# Patient Record
Sex: Female | Born: 1980 | Race: Black or African American | Hispanic: No | Marital: Married | Smoking: Never smoker
Health system: Southern US, Community
[De-identification: ages and names within clinical notes are randomized; demographics above are authoritative.]

## PROBLEM LIST (undated history)

## (undated) ENCOUNTER — Inpatient Hospital Stay (HOSPITAL_COMMUNITY): Payer: Self-pay

## (undated) DIAGNOSIS — R011 Cardiac murmur, unspecified: Secondary | ICD-10-CM

## (undated) DIAGNOSIS — G43909 Migraine, unspecified, not intractable, without status migrainosus: Secondary | ICD-10-CM

## (undated) DIAGNOSIS — J45909 Unspecified asthma, uncomplicated: Secondary | ICD-10-CM

## (undated) DIAGNOSIS — R87629 Unspecified abnormal cytological findings in specimens from vagina: Secondary | ICD-10-CM

## (undated) HISTORY — PX: WISDOM TOOTH EXTRACTION: SHX21

## (undated) SURGERY — Surgical Case
Anesthesia: *Unknown

---

## 2001-03-10 ENCOUNTER — Emergency Department (HOSPITAL_COMMUNITY): Admission: EM | Admit: 2001-03-10 | Discharge: 2001-03-11 | Payer: Self-pay | Admitting: Emergency Medicine

## 2002-06-18 ENCOUNTER — Emergency Department (HOSPITAL_COMMUNITY): Admission: EM | Admit: 2002-06-18 | Discharge: 2002-06-18 | Payer: Self-pay | Admitting: Emergency Medicine

## 2002-08-29 ENCOUNTER — Emergency Department (HOSPITAL_COMMUNITY): Admission: EM | Admit: 2002-08-29 | Discharge: 2002-08-29 | Payer: Self-pay

## 2002-10-31 ENCOUNTER — Emergency Department (HOSPITAL_COMMUNITY): Admission: EM | Admit: 2002-10-31 | Discharge: 2002-10-31 | Payer: Self-pay | Admitting: *Deleted

## 2002-10-31 ENCOUNTER — Encounter: Payer: Self-pay | Admitting: Emergency Medicine

## 2003-05-03 ENCOUNTER — Emergency Department (HOSPITAL_COMMUNITY): Admission: EM | Admit: 2003-05-03 | Discharge: 2003-05-04 | Payer: Self-pay | Admitting: Emergency Medicine

## 2003-05-04 ENCOUNTER — Encounter: Payer: Self-pay | Admitting: Emergency Medicine

## 2003-09-03 ENCOUNTER — Other Ambulatory Visit: Admission: RE | Admit: 2003-09-03 | Discharge: 2003-09-03 | Payer: Self-pay | Admitting: Obstetrics and Gynecology

## 2003-11-30 ENCOUNTER — Inpatient Hospital Stay (HOSPITAL_COMMUNITY): Admission: AD | Admit: 2003-11-30 | Discharge: 2003-12-01 | Payer: Self-pay | Admitting: Obstetrics and Gynecology

## 2004-02-02 ENCOUNTER — Inpatient Hospital Stay (HOSPITAL_COMMUNITY): Admission: AD | Admit: 2004-02-02 | Discharge: 2004-02-05 | Payer: Self-pay | Admitting: Obstetrics and Gynecology

## 2004-02-06 ENCOUNTER — Encounter (INDEPENDENT_AMBULATORY_CARE_PROVIDER_SITE_OTHER): Payer: Self-pay | Admitting: Specialist

## 2004-02-06 ENCOUNTER — Inpatient Hospital Stay (HOSPITAL_COMMUNITY): Admission: AD | Admit: 2004-02-06 | Discharge: 2004-02-09 | Payer: Self-pay | Admitting: Obstetrics and Gynecology

## 2004-05-19 ENCOUNTER — Emergency Department (HOSPITAL_COMMUNITY): Admission: EM | Admit: 2004-05-19 | Discharge: 2004-05-19 | Payer: Self-pay | Admitting: Family Medicine

## 2004-10-06 ENCOUNTER — Emergency Department (HOSPITAL_COMMUNITY): Admission: EM | Admit: 2004-10-06 | Discharge: 2004-10-06 | Payer: Self-pay | Admitting: Family Medicine

## 2004-11-02 ENCOUNTER — Emergency Department (HOSPITAL_COMMUNITY): Admission: EM | Admit: 2004-11-02 | Discharge: 2004-11-02 | Payer: Self-pay | Admitting: Family Medicine

## 2004-12-10 ENCOUNTER — Emergency Department (HOSPITAL_COMMUNITY): Admission: EM | Admit: 2004-12-10 | Discharge: 2004-12-10 | Payer: Self-pay | Admitting: Family Medicine

## 2005-05-20 ENCOUNTER — Emergency Department (HOSPITAL_COMMUNITY): Admission: EM | Admit: 2005-05-20 | Discharge: 2005-05-20 | Payer: Self-pay | Admitting: Family Medicine

## 2005-06-07 ENCOUNTER — Inpatient Hospital Stay (HOSPITAL_COMMUNITY): Admission: AD | Admit: 2005-06-07 | Discharge: 2005-06-07 | Payer: Self-pay | Admitting: Obstetrics and Gynecology

## 2005-06-24 ENCOUNTER — Other Ambulatory Visit: Admission: RE | Admit: 2005-06-24 | Discharge: 2005-06-24 | Payer: Self-pay | Admitting: Obstetrics and Gynecology

## 2005-11-06 ENCOUNTER — Inpatient Hospital Stay (HOSPITAL_COMMUNITY): Admission: AD | Admit: 2005-11-06 | Discharge: 2005-11-06 | Payer: Self-pay | Admitting: Obstetrics and Gynecology

## 2006-01-09 ENCOUNTER — Inpatient Hospital Stay (HOSPITAL_COMMUNITY): Admission: AD | Admit: 2006-01-09 | Discharge: 2006-01-09 | Payer: Self-pay | Admitting: Obstetrics and Gynecology

## 2006-01-10 ENCOUNTER — Inpatient Hospital Stay (HOSPITAL_COMMUNITY): Admission: AD | Admit: 2006-01-10 | Discharge: 2006-01-12 | Payer: Self-pay | Admitting: Obstetrics and Gynecology

## 2006-08-09 ENCOUNTER — Other Ambulatory Visit: Admission: RE | Admit: 2006-08-09 | Discharge: 2006-08-09 | Payer: Self-pay | Admitting: Obstetrics and Gynecology

## 2007-02-13 ENCOUNTER — Inpatient Hospital Stay (HOSPITAL_COMMUNITY): Admission: AD | Admit: 2007-02-13 | Discharge: 2007-02-13 | Payer: Self-pay | Admitting: Obstetrics and Gynecology

## 2007-02-15 ENCOUNTER — Ambulatory Visit (HOSPITAL_COMMUNITY): Admission: RE | Admit: 2007-02-15 | Discharge: 2007-02-15 | Payer: Self-pay | Admitting: Obstetrics and Gynecology

## 2007-02-15 ENCOUNTER — Encounter (INDEPENDENT_AMBULATORY_CARE_PROVIDER_SITE_OTHER): Payer: Self-pay | Admitting: Obstetrics and Gynecology

## 2009-09-28 ENCOUNTER — Emergency Department (HOSPITAL_COMMUNITY): Admission: EM | Admit: 2009-09-28 | Discharge: 2009-09-28 | Payer: Self-pay | Admitting: Family Medicine

## 2010-05-14 ENCOUNTER — Inpatient Hospital Stay (HOSPITAL_COMMUNITY): Admission: AD | Admit: 2010-05-14 | Discharge: 2010-05-15 | Payer: Self-pay | Admitting: Obstetrics and Gynecology

## 2010-11-27 LAB — URINALYSIS, ROUTINE W REFLEX MICROSCOPIC
Glucose, UA: NEGATIVE mg/dL
Protein, ur: NEGATIVE mg/dL
pH: 6 (ref 5.0–8.0)

## 2010-11-27 LAB — DIFFERENTIAL
Basophils Absolute: 0.1 10*3/uL (ref 0.0–0.1)
Basophils Relative: 1 % (ref 0–1)
Lymphs Abs: 2.1 10*3/uL (ref 0.7–4.0)
Monocytes Absolute: 0.5 10*3/uL (ref 0.1–1.0)
Monocytes Relative: 8 % (ref 3–12)

## 2010-11-27 LAB — CBC
HCT: 37.5 % (ref 36.0–46.0)
Hemoglobin: 12.9 g/dL (ref 12.0–15.0)
MCH: 33.3 pg (ref 26.0–34.0)
MCHC: 34.3 g/dL (ref 30.0–36.0)
RBC: 3.87 MIL/uL (ref 3.87–5.11)
RDW: 12.9 % (ref 11.5–15.5)

## 2010-11-27 LAB — HCG, QUANTITATIVE, PREGNANCY: hCG, Beta Chain, Quant, S: 7449 m[IU]/mL — ABNORMAL HIGH (ref <5)

## 2010-12-27 ENCOUNTER — Inpatient Hospital Stay (HOSPITAL_COMMUNITY)
Admission: AD | Admit: 2010-12-27 | Discharge: 2010-12-28 | Disposition: A | Discharge status: Home / Self Care | Payer: Medicaid Other | Source: Ambulatory Visit | Attending: Obstetrics and Gynecology | Admitting: Obstetrics and Gynecology

## 2010-12-27 DIAGNOSIS — O479 False labor, unspecified: Secondary | ICD-10-CM | POA: Insufficient documentation

## 2011-01-06 ENCOUNTER — Inpatient Hospital Stay (HOSPITAL_COMMUNITY)
Admission: EM | Admit: 2011-01-06 | Discharge: 2011-01-09 | DRG: 765 | Disposition: A | Discharge status: Home / Self Care | Payer: Medicaid Other | Source: Ambulatory Visit | Attending: Obstetrics and Gynecology | Admitting: Obstetrics and Gynecology

## 2011-01-06 ENCOUNTER — Other Ambulatory Visit: Payer: Self-pay | Admitting: Obstetrics and Gynecology

## 2011-01-06 DIAGNOSIS — O411090 Infection of amniotic sac and membranes, unspecified, unspecified trimester, not applicable or unspecified: Secondary | ICD-10-CM | POA: Diagnosis present

## 2011-01-06 DIAGNOSIS — O34219 Maternal care for unspecified type scar from previous cesarean delivery: Secondary | ICD-10-CM | POA: Diagnosis present

## 2011-01-06 DIAGNOSIS — Z22330 Carrier of Group B streptococcus: Secondary | ICD-10-CM

## 2011-01-06 DIAGNOSIS — O99892 Other specified diseases and conditions complicating childbirth: Secondary | ICD-10-CM | POA: Diagnosis present

## 2011-01-06 LAB — CBC
Hemoglobin: 11.2 g/dL — ABNORMAL LOW (ref 12.0–15.0)
MCH: 31.3 pg (ref 26.0–34.0)
MCV: 95 fL (ref 78.0–100.0)
RBC: 3.58 MIL/uL — ABNORMAL LOW (ref 3.87–5.11)

## 2011-01-06 LAB — DIFFERENTIAL
Basophils Absolute: 0 10*3/uL (ref 0.0–0.1)
Eosinophils Absolute: 0 10*3/uL (ref 0.0–0.7)
Monocytes Absolute: 1 10*3/uL (ref 0.1–1.0)
Neutro Abs: 15.2 10*3/uL — ABNORMAL HIGH (ref 1.7–7.7)
Neutrophils Relative %: 89 % — ABNORMAL HIGH (ref 43–77)

## 2011-01-07 LAB — CBC
MCH: 31 pg (ref 26.0–34.0)
MCHC: 32.5 g/dL (ref 30.0–36.0)
RBC: 3.29 MIL/uL — ABNORMAL LOW (ref 3.87–5.11)
RDW: 13.1 % (ref 11.5–15.5)

## 2011-01-09 ENCOUNTER — Encounter (HOSPITAL_COMMUNITY): Admission: RE | Admit: 2011-01-09 | Payer: Self-pay | Source: Ambulatory Visit

## 2011-01-09 LAB — DIFFERENTIAL
Basophils Absolute: 0 10*3/uL (ref 0.0–0.1)
Eosinophils Absolute: 0.2 10*3/uL (ref 0.0–0.7)
Eosinophils Relative: 2 % (ref 0–5)
Lymphocytes Relative: 18 % (ref 12–46)
Neutrophils Relative %: 72 % (ref 43–77)

## 2011-01-09 LAB — CBC
Platelets: 228 10*3/uL (ref 150–400)
WBC: 9.9 10*3/uL (ref 4.0–10.5)

## 2011-01-14 NOTE — Discharge Summary (Signed)
NAME:  [name] Fry, [name] Fry                 ACCOUNT NO.:  [id number]  MEDICAL RECORD NO.:  [id number]           PATIENT TYPE:  I  LOCATION:  9318                          [facility]  PHYSICIAN:  Jeffrey Voth A. Mily Malecki, M.D. DATE OF BIRTH:  1981/01/07  DATE OF ADMISSION:  01/06/2011 DATE OF DISCHARGE:                              DISCHARGE SUMMARY   ADMITTING DIAGNOSES: 1. Intrauterine pregnancy at 39-1/7 weeks. 2. Early active labor. 3. Positive group B strep. 4. Nonreassuring fetal heart rate pattern. 5. Probable chorioamnionitis. 6. Previous cesarean section with desire for vaginal birth after     cesarean with one previous successful vaginal birth after cesarean.  DISCHARGE DIAGNOSES: 1. Intrauterine pregnancy at term. 2. Spontaneous rupture of membranes with labor. 3. Failed VBAC due to NRFHR. 4. Nonreassuring fetal heart rates. 5. Chorioamnionitis. 6. Neonatal intensive care unit infant.  PROCEDURES: 1. Repeat low transverse cesarean section. 2. Spinal anesthesia.  HOSPITAL COURSE:  [name] Fry is a 30 year old, gravida 5, para 1-1-2-2, at 39-1/7 weeks who presented on the afternoon of January 06, 2011, with spontaneous rupture of membranes reported at approximately 2 a.m.  She presented to the hospital at approximately 12:25 p.m.  Her pregnancy had been remarkable for: 1. Previous cesarean section with successful VBAC subsequently 2. History of previous preterm delivery secondary to nonreassuring     fetal heart rate and abruption. 3. History of PIH. 4. History of abnormal Pap. 5. History of STDs. 6. Positive group B strep.  On arrival, the patient's temp was 99.2, fetal heart rate was in the 170s with decreased variability and no reactivity.  There were no decelerations noted.  The patient was noted to be leaking clear fluid. Other vital signs were stable.  Cervical exam was 4, 80%, vertex -1. The patient was admitted to Labor and Delivery; however, the fetal heart rate  tracing did not improve despite IV hydration, Tylenol, and oxygen therapy.  The patient's cervix fairly quickly changed to 6 cm.  Fetal scalp lead and IUPC were placed.  There were no accelerations noted with scalp stim.  Dr. Normand Sloop was consulted and the decision was made to proceed with cesarean section for nonreassuring fetal heart rate.  At that time, the patient's temp was 101.  She was taken to the operating room where Dr. Normand Sloop performed a repeat low transverse cesarean section for delivery of a viable female, weight was 7 pounds and 11 ounces.  Apgars were 9 and 9.  Infant was taken for sepsis workup to NICU given the patient's clinical circumstances.  Mother was taken to recovery in good condition.  By postop day #1, the patient was doing well.  She was pumping secondary to the baby being in NICU, although the baby was doing well.  The patient was afebrile.  Her physical exam was within normal limits.  She was having some gaseous distention.  Her hemoglobin was 10.2.  Her white blood cell count was 23.2 which was elevated from 17.2.  The patient's dressing was clean, dry, and intact and her lochia was scant.  By C-section day #2, she was feeling much better.  She was using pain medication with benefit.  She was thumping and nursing.  Temp was 99.3.  She completed 24 hours of IV antibiotics. Infant remained stable in NICU.  By postop day #3, the patient was again doing well.  She had a hemoglobin of 9.8.  On day #3, white blood cell count down to 9.9 and platelet count 228, neutrophils were 72%.  She did still have some gaseous distention, but bowel sounds were present in all quadrants.  Her incision was clean, dry, and intact.  Lochia was scant.  Fundus was firm.  Extremities were normal.  She was undecided about birth control at that time.  She was deemed to have received full benefit of her hospital stay and was discharged home in stable condition.  DISCHARGE INSTRUCTIONS:   Per [facility] handout.  DISCHARGE MEDICATIONS: 1. Motrin 600 mg p.o. q.6 h. p.r.n. pain. 2. Percocet 5/325 one to two p.o. q.3-4 h. p.r.n. pain.  DISCHARGE FOLLOWUP:  It will occur in 6 weeks at [facility].     Renaldo Reel Emilee Hero, C.N.M.   ______________________________ Pierre Bali Normand Sloop, M.D.    VLL/MEDQ  D:  01/09/2011  T:  01/09/2011  Job:  [id number]  Electronically Signed by Nigel Bridgeman C.N.M. on 01/09/2011 01:25:10 PM Electronically Signed by Jaymes Graff M.D. on 01/14/2011 10:48:11 AM

## 2011-01-14 NOTE — Op Note (Signed)
NAME:  [name] Fry, [name] Fry                 ACCOUNT NO.:  [id number]  MEDICAL RECORD NO.:  [id number]           PATIENT TYPE:  I  LOCATION:  9318                          [facility]  [facility]  PHYSICIAN:  Ygnacio Fecteau A. Nahdia Doucet, M.D. DATE OF BIRTH:  20-Jun-1981  DATE OF PROCEDURE: DATE OF DISCHARGE:                              OPERATIVE REPORT   PREOPERATIVE DIAGNOSES: 1. Pregnancy at term, rupture of membranes. 2. Attempted vaginal birth after cesarean section. 3. Nonreassuring fetal heart tones. 4. Chorioamnionitis.  POSTOPERATIVE DIAGNOSES: 1. Pregnancy at term, rupture of membranes. 2. Attempted vaginal birth after cesarean section. 3. Nonreassuring fetal heart tones. 4. Chorioamnionitis.  PROCEDURE:  Repeat cesarean section.  SURGEON:  Onyinyechi Huante A. Vong Garringer, MD  ASSISTANTS:  Anabel Halon, [facility], CNM and Renaldo Reel. Emilee Hero, CNM  ANESTHESIA:  Spinal.  FINDINGS:  Female infant in vertex presentation with clear fluid, born at 3, Apgars of 8 and 9.  Placenta was sent to pathology.  ESTIMATED BLOOD LOSS:  650 mL.  URINE OUTPUT:  250 mL clear urine.  IV FLUIDS:  2000 mL crystalloid.  Cord pH's are venous 7.37, arterial 7.33.  COMPLICATIONS:  None.  The patient went to PACU in stable condition.  PROCEDURE IN DETAIL:  The patient was taken to the operating room where she was given spinal anesthesia, placed in dorsal supine position with a left lateral tilt.  Once her anesthesia was found to be adequate, 10 mL of 0.5% Marcaine with epinephrine 1:200 was placed along the previous incision and incision was then made with a scalpel along her abdomen and carried down to the fascia using Bovie cautery.  The fascia was incised in midline, extended bilaterally using Mayo scissors.  Kochers x2 placed in the superior aspect of the fascia was dissected off the rectus muscle both sharply and bluntly.  The inferior aspect of the fascia was dissected in a similar fashion.  The peritoneum was identified,  tented up, entered sharply, and extended bluntly.  The bladder blade was inserted.  The vesicouterine peritoneum was identified, tented up, entered sharply, and extended bilaterally.  The bladder flap created digitally.  Bladder blade was reinserted and lower transverse uterine incision was made with a scalpel and extended bluntly transversely.  The bag of water was broken with Allis clamp with clear fluid.  The infant was delivered without difficulty.  Cord was clamped and cut.  Cord gases were obtained.  Again, venous pH was 7.37 arterial pH was 7.33.  The infant was handed off to waiting pediatricians.  The placenta was manually delivered.  The uterus cleared of all clot and debris.  The uterus incision was repaired with 0 Vicryl and a second layer of 0 Vicryl was used to imbricate the uterus.  There was a little bleeding area of the left side of the uterus.  The incision was made hemostatic with figure-of-eight suture.  Copious irrigation was done.  Hemostasis was assured.  The patient had normal abdominal anatomy.  Normal- appearing tubes and ovaries.  Peritoneum was reapproximated using 0 chromic in a running fashion.  The fascia was closed using 0 Vicryl in a  running fashion.  Before the fascia was closed, the muscles were irrigated, noted to be hemostatic.  Subcutaneous tissue was reapproximated using 2-0 plain, and the skin was closed using 3-0 Monocryl in a subcuticular fashion.  Sponge, lap, and needle counts were correct.  The patient was taken to recovery room in stable condition.     Red Mandt A. Normand Sloop, M.D.     NAD/MEDQ  D:  01/06/2011  T:  01/07/2011  Job:  [id number]  Electronically Signed by Jaymes Graff M.D. on 01/14/2011 10:47:54 AM

## 2011-01-27 NOTE — Op Note (Signed)
[name] Fry, [name] Fry                  ACCOUNT NO.:  [id number]   MEDICAL RECORD NO.:  [id number]            PATIENT TYPE:   LOCATION:                                 FACILITY:   PHYSICIAN:  Naima A. Dillard, M.D.      DATE OF BIRTH:   DATE OF PROCEDURE:  02/15/2007  DATE OF DISCHARGE:                               OPERATIVE REPORT   PREOPERATIVE DIAGNOSIS:  Blighted ovum, [redacted] week gestation.   POSTOPERATIVE DIAGNOSIS:  Blighted ovum, [redacted] week gestation.   PROCEDURE:  Dilation and evacuation.   ASSISTANT:  None.   ANESTHESIA:  MAC and local.   ESTIMATED BLOOD LOSS:  Minimal.   SPECIMENS:  Products of conception, sent to pathology.   COMPLICATIONS:  None.   DISPOSITION:  The patient went to [facility] unit in stable condition.   PROCEDURE IN DETAIL:  The patient was taken to the operating room where  she was given MAC anesthesia and placed in the dorsal lithotomy position  and prepped and draped in a normal sterile fashion.  The bladder was  also drained.  The uterus was noted to be 7 weeks size, retroverted and  no adnexal masses.  A bivalved speculum was inserted into the vagina.  The anterior lip of the cervix was grasped with a single tooth  tenaculum.  The cervix was infiltrated with 20 mL of 2% lidocaine for  cervical block.  The cervix was dilated with Pratt dilators, up to 21.  A size 7 suction curettage was placed into the uterine cavity and  suction curettage until a grit texture was noted.  A sharp curet was  placed into the uterine cavity and done until a gritty texture was  noted.  The suction curettage was replaced and again, scant blood was  noted which was removed from the vagina and cervix.  Sponge, lap and  instrument counts were noted x2.   The patient went to the recovery room in stable condition.      Naima A. Normand Sloop, M.D.  Electronically Signed     NAD/MEDQ  D:  02/15/2007  T:  02/16/2007  Job:  [id number]

## 2011-01-27 NOTE — Op Note (Signed)
NAMEFRANCIES, INCH                  ACCOUNT NO.:  [id number]   MEDICAL RECORD NO.:  [id number]            PATIENT TYPE:   LOCATION:                                 FACILITY:   PHYSICIAN:  Naima A. Dillard, M.D.      DATE OF BIRTH:   DATE OF PROCEDURE:  02/15/2007  DATE OF DISCHARGE:                               OPERATIVE REPORT   PREOPERATIVE DIAGNOSIS:  Blighted ovum.   POSTOPERATIVE DIAGNOSIS:  Blighted ovum.   PROCEDURE:  Dilation and evacuation.   ASSISTANT:  None.   ANESTHESIA:  MAC and local.   SPECIMENS:  Products of conception.   ESTIMATED BLOOD LOSS:  Minimal.   COMPLICATIONS:  None.   DISPOSITION:  The patient went to [facility] unit in stable condition.   PROCEDURE IN DETAIL:  The patient was taken to the operating room where  she was given MAC anesthesia and placed in the dorsal lithotomy position  and prepped and draped in a normal sterile fashion.  The bladder was  drained with a straight catheter.  A bivalved speculum was inserted into  the vagina.  The anterior lip of the cervix was grasped with a single  tooth tenaculum, 20 mL of 2% lidocaine was used for a cervical block.  The cervix was dilated with Pratt dilators, up to 23.  A size 7 suction  curettage was placed into the uterine cavity until a gritty texture was  noted.  A sharp curet was placed into the uterus and the uterus was  curetted until a gritty texture was noted of all walls of the uterus.  The suction curettage was replaced and a scant blood and products were  obtained.  All instruments were removed from the vagina and cervix.  The  tenaculum site was made hemostatic with sliver nitrate.  Sponge, lap and  instrument counts were correct.  The patient went to the recovery room  in stable condition.      Naima A. Normand Sloop, M.D.  Electronically Signed     NAD/MEDQ  D:  02/15/2007  T:  02/16/2007  Job:  [id number]

## 2011-01-27 NOTE — H&P (Signed)
[name] Fry, [name] Fry                  ACCOUNT NO.:  [id number]   MEDICAL RECORD NO.:  [id number]          PATIENT TYPE:  MAT   LOCATION:  MATC                          [facility]  [facility]   PHYSICIAN:  Naima A. Dillard, M.D. DATE OF BIRTH:  [birth year]   DATE OF ADMISSION:  02/13/2007  DATE OF DISCHARGE:                              HISTORY & PHYSICAL   [name] is a 30 year old, gravida 3, para 1-1-0-2 at 7-1/7 weeks by  last menstrual period who presented to maternity admissions unit on February 13, 2007, with complaint of lower abdominal cramping.  She had this  throughout most of her pregnancy and has become slightly worse today.  She denied any bleeding.  She appeared to be in no apparent distress.  Her history was remarkable for #1, previous preterm delivery at 31 weeks  for nonreassuring fetal heart rate by C section.  Number 2, history of  previous C section with subsequent vaginal birth after cesarean, and #3,  history of PIH.   LABORATORY DATA:  Blood type is A positive.  CBC showed hemoglobin 12.2,  hematocrit 35.7, white blood cell count 7.5, and platelets 303.  Neutrophils were 60.  Quantitative HCG was 30,652. Wet prep was done  that was negative.  GC and chlamydia cultures were done with pending  results at this point.   ALLERGIES:  IBUPROFEN and ALEVE.   OBSTETRICAL HISTORY:  In 2007, April, the patient had a vaginal birth  after cesarean of a viable female, weight 7 pounds 10 ounces.  She did  well and had no complications.  In 2005, the patient had a primary low  transverse cesarean section at 31 weeks and 2 days secondary to [facility],  nonreassuring fetal heart rate, positive parvovirus titers and  subsequent abruption with birth of a female infant named [name] Fry.  In  2003, the patient had a spontaneous miscarriage with no complications.   PAST MEDICAL HISTORY:  Unremarkable with no history of hypertension,  diabetes, thyroid disease, or other issues.   PAST SURGICAL  HISTORY:  She reports the previously noted primary low  transverse cesarean section.   FAMILY HISTORY:  Maternal grandmother has hypertension, diabetes.  Maternal great grandmother has lung cancer.   GENETIC HISTORY:  Unremarkable.   SOCIAL HISTORY:  The patient is engaged to be married.  The patient is  employed at [employer].  She lives with her two children  and her fiance.  She denies any alcohol, drug, or tobacco use during  this pregnancy.   REVIEW OF SYSTEMS:  The patient reports the previously noted lower  abdominal cramping.   PHYSICAL EXAMINATION:  VITAL SIGNS:  Stable.  The patient is afebrile.  HEENT:  Within normal limits.  LUNGS:  Bilateral breath sounds are clear.  HEART: Regular rate and rhythm  BREASTS:  Soft and nontender.  PELVIC:  Uterus is small and nontender, slightly at upper limits of  normal size.  Adnexa are nontender with no masses noted.   Wet prep was done that was negative.   Pelvic exam showed a closed cervix.  Urinalysis  was negative.   Ultrasound was done showing a cystic structure in the uterus with a  diameter of 15.7 mm for estimated gestational age of [redacted] weeks 3 days.  No  embryo cardiac activity noted.  No ectopic also noted.   EXTREMITIES: Deep tendon reflexes 2+ without clonus.  There is a trace  edema noted.  GC and chlamydia cultures were done and are pending.   ASSESSMENT:  1. Embryonic pregnancy or blighted ovum on ultrasound.  2. Unexpected pregnancy.   PLAN:  1. Review the options with the patient of observation, medical      management of miscarriage, or D&C.  After review of these options,      the patient and her partner elected to make a plan to schedule a      D&E, preferably with Dr. Normand Sloop if possible.  2. Risks and benefits of D&E were reviewed with the patient including      bleeding, infection, and damage to other organs.  The patient and      her partner state that they understand these risks and  wish to      proceed with scheduling this procedure with Dr. Normand Sloop as her      first choice.  Support was offered to the patient for this loss,      although this was an unexpected pregnancy.      Renaldo Reel [name] Fry, C.N.M.      Naima A. Normand Sloop, M.D.  Electronically Signed    VLL/MEDQ  D:  02/13/2007  T:  02/13/2007  Job:  [id number]

## 2011-01-30 NOTE — Op Note (Signed)
NAME:  ROYALTI, SCHAUF                            ACCOUNT NO.:  [id number]   MEDICAL RECORD NO.:  [id number]                   PATIENT TYPE:  INP   LOCATION:  9372                                 [facility]ILITY:  [facility]   PHYSICIAN:  Naima A. Dillard, M.D.              DATE OF BIRTH:  August 07, 1981   DATE OF PROCEDURE:  02/06/2004  DATE OF DISCHARGE:                                 OPERATIVE REPORT   PREOPERATIVE DIAGNOSES:  1. Nonreassuring fetal heart tones.  2. Pregnancy-induced hypertension at 31 weeks.   POSTOPERATIVE DIAGNOSES:  1. Severe preeclampsia at 31 weeks.  2. Nonreassuring fetal heart tones.  3. Questionable abruption.   PROCEDURE:  Urgent primary low transverse cesarean section.   SURGEON:  Naima A. Dillard, M.D.   ASSISTANT:  Rica Koyanagi, C.N.M.   ESTIMATED BLOOD LOSS:  600 mL.   INTRAVENOUS FLUIDS:  1600 mL crystalloid.   URINE OUTPUT:  200 mL of clear urine.   ANESTHESIA:  Spinal.   COMPLICATIONS:  None.   FINDINGS:  A female infant in vertex presentation with Apgars of 5 and 7 and  a cord pH of 7.16, weight of 1388 g. There was clear fluid. There was a clot  about 3-4 cm that appeared old and consistent with abruption just behind the  placenta. Normal-appearing uterus, tubes and ovaries bilaterally.   PROCEDURE IN DETAIL:  The patient was taken to the operating room, given  spinal anesthesia, prepped and draped in a normal sterile fashion. A  Pfannenstiel skin incision was then made with a scalpel and the remainder of  the incision was opened using Bovie cautery. The fascia was incised in the  midline and extended bilaterally. Kocher's x2 were placed on the superior  aspect of the fascia which was dissected both bluntly and sharply. The  inferior aspect of the fascia was dissected off the rectus muscles in a  similar fashion. The rectus muscles were separated in the midline. The  peritoneum was identified, tented up and then entered sharply and extended  bluntly. A bladder blade was inserted. The vesicouterine peritoneum was  identified, tented up and entered sharply and extended bilaterally using  Metzenbaum scissors. A bladder flap was created digitally, the bladder blade  was reinserted. A primary low transverse uterine incision was then made with  a scalpel and extended bluntly. The infant was delivered atraumatically, had  one loose nuchal cord, clear fluid and the findings noted above were seen.  The cord was clamped and cut and infant was handed off to awaiting  pediatricians. Placenta was delivered without difficulty and sent off to  pathology. The uterine incision was repaired with 0 Vicryl in a running-  locked fashion. There was a small hematoma probably about 2 cm which did not  appear to grow on the lower uterine segment. A second layer of imbrication  was then done and hemostasis was assured. Irrigation  was then done,  hemostasis was assured. The patient was noted to have normal tubes and  ovaries. The peritoneum was closed with 0 chromic. The rectus muscles were  irrigated and any bleeding areas were made hemostatic with the Bovie. The  fascia was closed with 0 Vicryl in a running fashion. The subcutaneous  tissue was irrigated and made hemostatic with Bovie cautery. The skin was  closed with staples. Sponge, lap and needle counts were correct x2. The  patient went to recovery room in stable condition.                                               Naima A. Normand Sloop, M.D.    NAD/MEDQ  D:  02/06/2004  T:  02/07/2004  Job:  [id number]

## 2011-01-30 NOTE — Discharge Summary (Signed)
[name], Fry                  ACCOUNT NO.:  [id number]   MEDICAL RECORD NO.:  [id number]          PATIENT TYPE:  INP   LOCATION:  9106                          [facility]   PHYSICIAN:  Janine Limbo, M.D.DATE OF BIRTH:  25-Jul-1981   DATE OF ADMISSION:  01/10/2006  DATE OF DISCHARGE:  01/12/2006                                 DISCHARGE SUMMARY   ADMITTING DIAGNOSIS:  1.  Intrauterine pregnancy at 40 weeks in labor.  2.  Previous cesarean section, desires vaginal birth after cesarean.   PROCEDURE:  1.  Vaginal birth after cesarean.  2.  Repair of first-degree vaginal laceration and right labial laceration.   DISCHARGE DIAGNOSIS:  1.  Intrauterine pregnancy at term, delivered.  2.  Vaginal birth after cesarean delivery.  3.  Repair of first-degree vaginal laceration and right labial laceration.   [name] is a 30 year old gravida 2, para 0-1-1-1 who presents at [redacted] weeks  gestation.  EDD January 10, 2006.  She presents in early active labor at 4 cm  dilated.  Her labor progressed normally to 7 cm at which time hypotonic  uterine contractions were noted.  An IUPC was placed and Pitocin begun per  patient consent after reviewing the risks and benefits of VBAC and oxytocic  medications.  Patient's labor progressed normally once adequate contractions  were established.  She developed an epidural hot spot and her epidural  catheter was replaced providing comfort.  She became completely dilated and  pushed well to vaginal delivery after cesarean.  She gave birth on January 10, 2006 to a 7 pound 10 ounce female infant named Actuary.  Both mother and  infant have done well in the post partum period.  Patient's vital signs  remained stable.  She is afebrile.  Her abdomen is soft and nontender.  Her  fundus is firm with moderate lochia.  On this her second postpartum day she  is judged to be in satisfactory condition for discharge.   DISCHARGE INSTRUCTIONS:  Per [facility]  handout.   DISCHARGE MEDICATIONS:  1.  Tylox one to two p.o. q.3-4h. p.r.n. pain.  2.  Prenatal vitamins.   DISCHARGE FOLLOW-UP:  At the office [facility] in six weeks      Rica Koyanagi, C.N.M.      Janine Limbo, M.D.  Electronically Signed    SDM/MEDQ  D:  01/12/2006  T:  01/12/2006  Job:  [id number]

## 2011-01-30 NOTE — H&P (Signed)
NAME:  [name] Fry, [name] Fry                            ACCOUNT NO.:  [id number]   MEDICAL RECORD NO.:  [id number]                   PATIENT TYPE:  INP   LOCATION:  9372                                 FACILITY:  [facility]   PHYSICIAN:  Naima A. Dillard, M.D.              DATE OF BIRTH:  1980/11/30   DATE OF ADMISSION:  02/06/2004  DATE OF DISCHARGE:                                HISTORY & PHYSICAL   HISTORY OF PRESENT ILLNESS:  [name] is a 30 year old gravida 2, para 0-0-1-  0 at 31-5/7 weeks by LMP and first-trimester ultrasound.  She presented to  the office today with decreased fetal movement.  She had been admitted to  the hospital on Feb 02, 2004 for headache and increased swelling. At that  time she was diagnosed with elevated blood pressures, which normalized on  bed rest.  She also had a 24-hr urine that showed  251 mg of protein in a 24-hr specimen.  She was maintained in the hospital  until Feb 05, 2004, through which time she received two doses of  betamethasone.  Her fetal heart rate remained reassuring throughout her  time, and she was discharged home on bed rest to follow-up this week for  nonstress test and probable repeat 24-hr urine.  Her pregnancy had been  initially followed by the nurse midwife service, but she has also been  managed by the physician's service during this last week of hospital care.  On evaluation in the office she was noted to have a nonreactive NST with a  baseline fetal heartbeat of 170.  There were late decelerations noted with  very, very mild contractions approximately every 10 mins.  She is therefore  admitted to the [facility] for further evaluation and  possible cesarean section, secondary to nonreassuring fetal heart rate.  In  the office her blood pressures were elevated and she did have protein in her  urine.   PREGNANCY RISKS:  1. Pre-eclampsia.  2. History of breast reduction.  3. Irregular cycles.  4. Second-trimester  SAB.  5. Father of the baby with possible sickle cell trait.  6. Second trimester chlamydia with negative test of cure.  7. 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   PRENATAL LABS:  Blood type A positive.  Rh antibody negative.  VDRL  nonreactive.  Rubella titer positive.  Hepatitis B surface antigen negative.  HIV nonreactive.  GC culture was negative at her first visit; chlamydia was  positive.  Chlamydia was positive again on November 27, 2003, but was negative  on December 28, 2003.  Beta strep testing has not been done.  Pap was within  normal limits.  Hemoglobin upon entering into practice was 11.8; it was  within normal limits at 28 weeks.  She also had a negative  Glucola.  Her  platelets on new OB evaluation were 317,000.   HISTORY OF PRESENT PREGNANCY:  The patient entered care at approximately  nine weeks, with an [facility] of April 07, 2003 (established by first trimester  ultrasound on September 03, 2003).  She was treated with Zithromax for the  chlamydia on September 06, 2003.  She had an ultrasound at 17 weeks showing  consistent growth and development.  The patient was evaluated at 21 weeks  secondary to cramping.  She remained with a positive cervical chlamydia  culture, which was again treated with a negative test of cure on December 28, 2003.  The rest of her pregnancy care had been unremarkable, until she  presented to the hospital on Feb 02, 2004 with headache and increased  swelling.  At that time she was noted to have elevated blood pressures with  the diastolics of 90.  Her weight was approximately 213.  She had normal  Pregnancy-induced hypertension  labs.  She had a 24-hr urine begun, that  ended on Feb 03, 2004; at that time she had 251 mg of protein in a 24-hr  specimen.  Her fetal heart rate was reassuring throughout the time of her  admission.  She did receive two doses of betamethasone on May 23 and  Feb 05, 2004.  Again, her blood pressures have essentially normalized on bed rest,  and she was discharged to home on Feb 05, 2004 to return to the office on  Feb 08, 2004 for visit by physical profile and re-evaluation.  She also had  a history of positive sera conversion to parvo, for which she was going to  be followed the rest of her pregnancy.  The patient had parvo titers done on  approximately Jan 17, 2004, with history of fifth disease exposure.  Initial  titers were drawn and convalescent titers were done subsequently, showing  positive sero conversion during this pregnancy.  The plan had been made to  follow this with growth and biophysical profiles.   OBSTETRICAL HISTORY:  In June 2003 the patient had a spontaneous miscarriage  at 16 weeks, with no other complications.   PAST MEDICAL HISTORY:  She reports the usual childhood illnesses.  She has a  history of anemia.   ALLERGIES:  ALEVE, IBUPROFEN (both of which cause shortness of breath,  swelling and rash).   FAMILY HISTORY:  Maternal grandfather had hypertension and diabetes.  Maternal great-grandmother with lung cancer.   GENETIC HISTORY:  Remarkable for the patient born with a heart murmur and  the father of the baby with questionable sickle cell trait.   SOCIAL HISTORY:  The patient is single.  She is high school educated and  employed in Associate Professor at [employer].  Father of the baby's name is  Hinton Dyer; he had an 11th grade education and is unemployed.  The patient  denies any alcohol, drug or tobacco use during this pregnancy.   PHYSICAL EXAMINATION:  VITAL SIGNS:  Blood pressures today in the office  were initially 150/90 on two evaluations; while the patient was there blood  pressure did increase to 170/100.  Other vital signs are stable.  The  patient is afebrile.  HEENT:  Within normal limits.  LUNGS:  Bilateral breath sounds are clear. HEART:  Regular rate and rhythm without murmur.  BREASTS:  Soft  and nontender.  ABDOMEN:  Fundal height is approximately 31-32 cm.  Abdomen is soft and  nontender.  The patient is  unaware of any contractions.  PELVIC:  Deferred.  FETAL MONITORING:  Fetal heart rate in the 170's, with no variability noted  on external monitor.  There are late decelerations noted, with very, very  mild contractions noted approximately every 10 mins.  EXTREMITIES:  Deep tendon reflexes are one and two-plus, with 1-2 plus edema  noted.  No clonus is noted.   LABORATORY DATA:  Clean catch urine shows 3+ protein on a voided specimen.   ASSESSMENT:  1. Intrauterine pregnancy at 31-5/7 weeks.  2. Pre-eclampsia.  3. Nonreassuring fetal heart.  4. Preterm pregnancy.   PLAN:  1. The patient is to be admitted to the [facility] Labor     and Delivery Department, for consult with Dr. Pierre Bali. Dillard as     attending physician.  2. Electronic fetal monitoring will be initiated immediately.  3. Pregnancy-induced hypertension  labs will be done.  4. Dr. Normand Sloop will see the patient right away upon her admission, and will     determine further management.  5. The patient understands that based upon the fetal heart rate pattern and     blood pressure evaluations, that delivery may be required today.     Renaldo Reel Emilee Hero, C.N.M.                   Naima A. Normand Sloop, M.D.    Leeanne Mannan  D:  02/06/2004  T:  02/06/2004  Job:  [id number]
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
## 2011-01-30 NOTE — Discharge Summary (Signed)
NAME:  Fry Fry                            ACCOUNT NO.:  [id number]   MEDICAL RECORD NO.:  [id number]                   PATIENT TYPE:  INP   LOCATION:  9105                                 [facility]ILITY[facility]   PHYSICIAN:  Crist Fat. Rivard, M.D.              DATE OF BIRTH:  [birth year]   DATE OF ADMISSION:  02/06/2004  DATE OF DISCHARGE:                                 DISCHARGE SUMMARY   ADMISSION DIAGNOSES:  1. Intrauterine pregnancy at 31 weeks.  2. Severe preeclampsia.  3. Nonreassuring fetal heart tones.   DISCHARGE DIAGNOSES:  1. Intrauterine pregnancy at 31 weeks.  2. Severe preeclampsia.  3. Nonreassuring fetal heart tones.  4. Questionable abruption.   PROCEDURE:  Primary low transverse cesarean section.   HOSPITAL COURSE:  Fry Fry is a 30 year old gravida 2 para 0-0-1-0 at 51 and  five-sevenths weeks who presented to the office with decreased fetal  movement.  She had been admitted to the hospital on Feb 02, 2004 for  headache and increased swelling and was diagnosed with elevated blood  pressures.  She had had a 24-hour urine that showed 251 mg of protein and  she kept in the hospital until Feb 05, 2004 while receiving betamethasone.  She presented to the office on May 25 to follow up with a nonstress test.  During that evaluation she was noted to have a nonreactive nonstress test  with late decelerations that were occurring with very, very mild uterine  contractions.  She was admitted to [facility] for further evaluation  and it was decided that she needed a cesarean section in order to deliver  the fetus.  Fetal heart tones were in the 170s with decreased long-term  variability as well as late decelerations.  She was having irregular  contractions on the tocometer.  The patient's lab work and blood pressures  were suggestive of preeclampsia and she was started on magnesium sulfate.  The patient was taken to the operating room and cesarean section was  performed by Dr. Jaymes Graff and Mack Guise, certified nurse midwife  served as first assistant.  The infant was a viable female delivered from  the vertex presentation with Apgars of 5 and 7, cord pH of 7.16, and weight  of 1388 g.  She was taken to the NICU in good condition.  After the delivery  of placenta a clot was noted suggestive of abruption.  The clot was about 3-  4 cm in size. The patient tolerated the procedure well and was taken to the  recovery room in good condition as well as on magnesium sulfate.  By  postoperative day #1 the patient's vital signs were stable.  During her  first night postoperatively her diastolic blood pressures were somewhat  elevated, ranging from 88 to 98.  She was afebrile, O2 saturations were  normal, her lungs were clear, and she was beginning to  diurese.  Her lab  work was improving and it was decided that her magnesium could be  discontinued 24 hours after delivery and she was transferred to the floor.  By postoperative day #2 the patient was doing well.  She was breast pumping.  The infant was doing well in the NICU.  Her vital signs were stable on  labetalol 100 mg p.o. b.i.d.  By postoperative day #3 the patient was doing  well and ready to go home.  The infant continued to do well in the NICU.  The patient desired birth control pills for contraception and to change to  the patch after weaning.  Her vital signs showed blood pressure was slightly  elevated at 177/93 and 144/80.  Her labetalol was changed to 200 mg p.o.  b.i.d.  Otherwise, the patient was doing fine and was deemed to have  received the full benefit of her hospital stay.   DISCHARGE INSTRUCTIONS:  Per [facility] handout with the addition  of calling for any signs and symptoms of preeclampsia.   DISCHARGE FOLLOW-UP:  Will occur at [facility] next week for a  blood pressure check.   DISCHARGE MEDICATIONS:  1. Labetalol 200 mg p.o. b.i.d.  2. Tylox  one to two p.o. q.3-4h. p.r.n. pain.  3. Tylenol Extra Strength over-the-counter.  4. Prenatal vitamin one p.o. daily.     Cam Hai, C.N.M.                     Crist Fat Rivard, M.D.    KS/MEDQ  D:  02/09/2004  T:  02/09/2004  Job:  [id number]

## 2011-01-30 NOTE — Discharge Summary (Signed)
NAME:  [name] Fry, [name] Fry                            ACCOUNT NO.:  [id number]   MEDICAL RECORD NO.:  [id number]                   PATIENT TYPE:  OBV   LOCATION:  9169                                 FACILITY:  [facility]   PHYSICIAN:  Osborn Coho, M.D.                DATE OF BIRTH:  01-17-1981   DATE OF ADMISSION:  02/02/2004  DATE OF DISCHARGE:                                 DISCHARGE SUMMARY   ADMISSION DIAGNOSES:  1. Intrauterine pregnancy at 31 and one-seventh weeks.  2. Elevated blood pressures.  3. Mild proteinuria.  4. Anemia.  5. History of chlamydia.   DISCHARGE DIAGNOSIS:  1. Intrauterine pregnancy at 33 and three-sevenths weeks.  2. Resolving hypertension with bedrest.  3. Recent seroconversion of parvovirus titers.   PROCEDURES THIS ADMISSION:  None.   HOSPITAL COURSE:  [name] is a 30 year old single black female gravida 2  para 0-0-1-0 at 84 and one-seventh weeks who presented complaining of  headache unrelieved by Extra Strength Tylenol and increased swelling of her  hands and feet.  On admission she was noted to have blood pressures of 138  to 144 over 87 to 102 and had 1+ protein on a traumatic catheterization.  She was admitted for observation and bedrest and was started on a 24-hour  urine.  She also received an ultrasound on Feb 03, 2004 that showed normal  growth in the 75th to 90th percentile, normal fluid, vertex presentation,  and grade 2 placenta, and a biophysical profile of 8/8.  Her 24-hour urine  showed less than 300 mg of protein.  She was given a betamethasone series  starting on May 23, receiving her second dose on May 24 and is deemed okay  for discharge due to her blood pressures being in the 120s to 130s over 60s  and 70s while she has been on bedrest.  Her discharge weight today is 218.  She did have parvovirus titers done recently - the first on Jan 17, 2004 -  and her IgG and IgM were both negative.  Her subsequent titers on May 19 -  her IgM was  positive at 5.61 and her IgG was equivocal.   DISCHARGE MEDICATIONS:  Prenatal vitamins daily.   DISCHARGE INSTRUCTIONS:  1. To be on strict bedrest.  2. To call for signs or symptoms of preeclampsia.  3. To monitor fetal movement with kick counts.  4. To call for signs or symptoms of preterm labor or labor.   DISCHARGE LABORATORY DATA:  Her parvovirus titers:  Her IgM is 5.61 and her  IgG is equivocal on Jan 31, 2004 with negative titers on Jan 17, 2004.  Her  discharge total protein is 251 mg in 24 hours.  Her hemoglobin is 9.8, her  platelets are 368.  Her AST is 20, her ALT is 24, uric acid is 5.3, LDH is  198.   DISCHARGE FOLLOW-UP:  For a blood pressure check and evaluation on Thursday,  Feb 07, 2004 at 3:15 p.m. with Dr. Osborn Coho and on Feb 12, 2004 a  biophysical profile at 1 p.m. with a follow-up visit with Dr. Estanislado Pandy.  She  is also to have weekly biophysical profiles due to her seroconversion of  parvovirus and growth ultrasounds every 3-4 weeks per Dr. Normand Sloop.   DISCHARGE STATUS:  Well and stable.     Concha Pyo. Duplantis, C.N.M.              Osborn Coho, M.D.    SJD/MEDQ  D:  02/05/2004  T:  02/05/2004  Job:  [id number]
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
## 2011-01-30 NOTE — H&P (Signed)
NAME:  [name] Fry, [name] Fry                            ACCOUNT NO.:  [id number]   MEDICAL RECORD NO.:  [id number]                   PATIENT TYPE:  OBV   LOCATION:  9169                                 [facility]   PHYSICIAN:  Hal Morales, M.D.             DATE OF BIRTH:  May 17, 1981   DATE OF ADMISSION:  02/02/2004  DATE OF DISCHARGE:                                HISTORY & PHYSICAL   TWENTY-THREE-HOUR OBSERVATION ADMISSION   HISTORY OF PRESENT ILLNESS:  [name] is a 30 year old single black female  gravida 2 para 0-0-1-0 at 39 and one-seventh weeks who presents with  headache unrelieved with Extra Strength Tylenol as well as increased  swelling in her hands and feet.  She denies leaking, bleeding, and visual  disturbances.  No nausea and vomiting.  Her pregnancy has been followed by  the [facility] certified nurse midwife service and has been  remarkable for:  1. History of breast reduction.  2. Irregular cycles.  3. Second trimester SAB.  4. Father of the baby with possible sickle cell trait.  5. Second trimester chlamydia with negative test of cure this pregnancy.   Her prenatal labs were collected on September 10, 2003:  Hemoglobin 11.8;  hematocrit 35.6; platelets 317,000; blood type A positive; antibody  negative; RPR nonreactive; rubella immune; hepatitis B surface antigen  negative; HIV nonreactive.  Pap smear within normal limits.  Gonorrhea  negative, chlamydia was positive.  Chlamydia was also positive again on  November 27, 2003.  It was negative on December 28, 2003.   HISTORY OF PRESENT PREGNANCY:  She presented for care at [facility] on  September 03, 2003 at [redacted] weeks gestation with an [facility] of April 06, 2004  established by pregnancy ultrasonography on September 03, 2003.  She was  treated with Zithromax for the chlamydia on December 23.  Second pregnancy  ultrasonography at [redacted] weeks gestation shows growth consistent with the  previous dating.  The  patient had an evaluation at 21 weeks due to lower  abdominal cramping.  Cervical cultures at that time again showed positive  chlamydia which was treated again with Zithromax with a negative test of  cure done on April 15.  The rest of her prenatal care has been unremarkable.   OBSTETRICAL HISTORY:  She is a gravida 2 para 0-0-1-0.  In June 2003 she had  an SAB at [redacted] weeks gestation with no complications.   MEDICAL HISTORY:  She has allergies to ALEVE and IBUPROFEN resulting in  shortness of breath, swelling, and rash.  She reports having had the usual  childhood illnesses.  She has a history of anemia.   FAMILY MEDICAL HISTORY:  Remarkable for maternal grandmother with  hypertension as well as diabetes and maternal great-grandmother with lung  cancer.   GENETIC HISTORY:  Remarkable for the patient born with heart murmur and  father of  the baby questionable sickle cell trait.   SOCIAL HISTORY:  The patient is single.  The father-of-the-baby's name is  [place].  The patient is high-school educated and employed at [employer].  The father of the baby has 11-year high-school education and is  unemployed.  They deny any alcohol, tobacco, or illicit drug use with the  pregnancy.   OBJECTIVE:  VITAL SIGNS:  Blood pressures have been 138/87 and 144/102.  Other vital signs are stable; she is afebrile.  HEENT:  Grossly within normal limits.  CHEST:  Clear to auscultation.  HEART:  Regular rate and rhythm.  ABDOMEN:  Gravid in contour with fundal height extending approximately 31 cm  above the pubic symphysis.  Fetal heart rate is in the 140s to 145,  reassuring, with 10 x 10 accelerations, no decelerations, and occasional  mild variables, no uterine contractions.  PELVIC:  Deferred.  EXTREMITIES:  Have 2+ edema with normal reflexes.   Catheterized urinalysis is remarkable for moderate hemoglobin with 30 of  protein with an atraumatic catheterization.  PIH lab work is within normal   limits.   ASSESSMENT:  1. Intrauterine pregnancy at 31 and one-seventh weeks.  2. Elevated blood pressure.  3. Slight proteinuria.   PLAN:  1. Admit to antenatal unit for 23-hour observation with Dr. Pennie Rushing.  2. A 24-hour urine collection.  3. Plan OB ultrasound for growth.  4. M.D. to follow care.     Cam Hai, C.N.M.                     Hal Morales, M.D.    KS/MEDQ  D:  02/03/2004  T:  02/04/2004  Job:  [id number]

## 2011-07-02 LAB — CBC
Hemoglobin: 12.2
MCHC: 34.2
MCV: 91.8
RBC: 3.89

## 2011-07-02 LAB — URINALYSIS, ROUTINE W REFLEX MICROSCOPIC
Glucose, UA: NEGATIVE
Ketones, ur: NEGATIVE
Nitrite: NEGATIVE
pH: 6

## 2011-07-02 LAB — WET PREP, GENITAL

## 2011-07-02 LAB — GC/CHLAMYDIA PROBE AMP, GENITAL
Chlamydia, DNA Probe: NEGATIVE
GC Probe Amp, Genital: NEGATIVE

## 2011-07-02 LAB — DIFFERENTIAL
Basophils Relative: 1
Eosinophils Absolute: 0.1

## 2012-03-15 ENCOUNTER — Encounter (HOSPITAL_COMMUNITY): Payer: Self-pay | Admitting: Emergency Medicine

## 2012-03-15 ENCOUNTER — Emergency Department (HOSPITAL_COMMUNITY)
Admission: EM | Admit: 2012-03-15 | Discharge: 2012-03-15 | Disposition: A | Discharge status: Home / Self Care | Payer: Self-pay | Attending: Emergency Medicine | Admitting: Emergency Medicine

## 2012-03-15 DIAGNOSIS — R070 Pain in throat: Secondary | ICD-10-CM | POA: Insufficient documentation

## 2012-03-15 DIAGNOSIS — M542 Cervicalgia: Secondary | ICD-10-CM | POA: Insufficient documentation

## 2012-03-15 DIAGNOSIS — R51 Headache: Secondary | ICD-10-CM | POA: Insufficient documentation

## 2012-03-15 DIAGNOSIS — J020 Streptococcal pharyngitis: Secondary | ICD-10-CM

## 2012-03-15 MED ORDER — HYDROCODONE-ACETAMINOPHEN 7.5-500 MG/15ML PO SOLN: 15.0000 mL | Freq: Four times a day (QID) | ORAL | Status: AC | PRN

## 2012-03-15 MED ORDER — PENICILLIN G BENZATHINE 1200000 UNIT/2ML IM SUSP
1.2000 10*6.[IU] | Freq: Once | INTRAMUSCULAR | Status: AC
  Administered 2012-03-15: 1.2 10*6.[IU] via INTRAMUSCULAR
  Filled 2012-03-15: qty 2

## 2012-03-15 NOTE — ED Provider Notes (Signed)
History     CSN: [id number]  Arrival date & time 03/15/12  1958   First MD Initiated Contact with Patient 03/15/12 2133      Chief Complaint  Patient presents with  . Headache  . Neck Pain    (Consider location/radiation/quality/duration/timing/severity/associated sxs/prior treatment) HPI  31 year old female presents complaining of headache, neck pain and sore throat. Patient states 3 days ago she woke up and noticed a headache. Pain is described as a throbbing sensation to forehead that was constant but has improved. Denies light/sound sensitivity, vision changes.  She also notices that her throat is sore.  Pain worsen with swallowing.  Denies drooling, voice changes, or throat swelling.  Also notice neck pain.  Pain to R side of neck, sore, worsening with turning head.  Denies neck stiffness, rash, jaw pain.  Has tried OTC acetaminophen with some relief.  Denies fever, sneezing, runny nose, ear pain, cp, sob, n/v, abd pain or rash.  Denies recent sick contact.    History reviewed. No pertinent past medical history.  Past Surgical History  Procedure Date  . Cesarean section     History reviewed. No pertinent family history.  History  Substance Use Topics  . Smoking status: Never Smoker   . Smokeless tobacco: Not on file  . Alcohol Use: No     socially    OB History    Grav Para Term Preterm Abortions TAB SAB Ect Mult Living                  Review of Systems  All other systems reviewed and are negative.    Allergies  Aleve  Home Medications  No current outpatient prescriptions on file.  BP 128/76  Pulse 94  Temp 98.4 F (36.9 C) (Oral)  Resp 18  SpO2 99%  LMP 02/21/2012  Physical Exam  Nursing note and vitals reviewed. Constitutional: She is oriented to person, place, and time. She appears well-developed and well-nourished. No distress.       Awake, alert, nontoxic appearance  HENT:  Head: Atraumatic. No trismus in the jaw.  Right Ear: External ear  normal.  Left Ear: External ear normal.  Nose: Nose normal.  Mouth/Throat: Uvula is midline, oropharynx is clear and moist and mucous membranes are normal. No dental abscesses or dental caries. No oropharyngeal exudate, posterior oropharyngeal edema, posterior oropharyngeal erythema or tonsillar abscesses.  Eyes: Conjunctivae are normal. Right eye exhibits no discharge. Left eye exhibits no discharge.  Neck: Trachea normal and normal range of motion. Neck supple. No JVD present. No spinous process tenderness and no muscular tenderness present. Carotid bruit is not present. No rigidity. No edema, no erythema and normal range of motion present. No Brudzinski's sign and no Kernig's sign noted. No mass present.  Cardiovascular: Normal rate and regular rhythm.   Pulmonary/Chest: Effort normal. No respiratory distress. She exhibits no tenderness.  Abdominal: Soft. There is no tenderness. There is no rebound.  Musculoskeletal: She exhibits no tenderness.       ROM appears intact, no obvious focal weakness  Neurological: She is alert and oriented to person, place, and time. No cranial nerve deficit. Coordination normal. GCS eye subscore is 4. GCS verbal subscore is 5. GCS motor subscore is 6.       Mental status and motor strength appears intact  Skin: No rash noted.  Psychiatric: She has a normal mood and affect.    ED Course  Procedures (including critical care time)  Labs Reviewed - No  data to display No results found.   No diagnosis found.  Results for orders placed during the hospital encounter of 03/15/12  RAPID STREP SCREEN      Component Value Range   Streptococcus, Group A Screen (Direct) POSITIVE (*) NEGATIVE   No results found.    MDM  Headache, sore throat and neck pain.  Pt in NAD, VSS, afebrile, nontoxic, no meningismal signs or focal neuro deficits.  No stiff neck.  Throat exam unremarkable, no evidence of PTA, or Ludwigs.  Strep test sent.     10:19 PM Strep test  positive.  Pen VK given IM.  Will dc with pain medication, and steroid.       Fayrene Helper, PA-C 03/15/12 2219

## 2012-03-15 NOTE — ED Notes (Addendum)
Patient with headache and neck pain.  Patient states she has a hard time swallowing and turning her head.  This started on Sunday.  Patient states that she started with a sore throat.  She is able to turn head and bring chin to chest with no problem at this time.

## 2012-03-16 NOTE — ED Provider Notes (Signed)
Medical screening examination/treatment/procedure(s) were performed by non-physician practitioner and as supervising physician I was immediately available for consultation/collaboration.  Juliet Rude. Rubin Payor, MD 03/16/12 [id number]

## 2013-07-10 ENCOUNTER — Emergency Department (HOSPITAL_COMMUNITY)
Admission: EM | Admit: 2013-07-10 | Discharge: 2013-07-11 | Disposition: A | Discharge status: Home / Self Care | Payer: Medicaid Other | Attending: Emergency Medicine | Admitting: Emergency Medicine

## 2013-07-10 ENCOUNTER — Encounter (HOSPITAL_COMMUNITY): Payer: Self-pay | Admitting: Emergency Medicine

## 2013-07-10 DIAGNOSIS — H53149 Visual discomfort, unspecified: Secondary | ICD-10-CM | POA: Insufficient documentation

## 2013-07-10 DIAGNOSIS — Z888 Allergy status to other drugs, medicaments and biological substances status: Secondary | ICD-10-CM | POA: Insufficient documentation

## 2013-07-10 DIAGNOSIS — Z79899 Other long term (current) drug therapy: Secondary | ICD-10-CM | POA: Insufficient documentation

## 2013-07-10 DIAGNOSIS — Z8669 Personal history of other diseases of the nervous system and sense organs: Secondary | ICD-10-CM | POA: Insufficient documentation

## 2013-07-10 DIAGNOSIS — H9209 Otalgia, unspecified ear: Secondary | ICD-10-CM | POA: Insufficient documentation

## 2013-07-10 DIAGNOSIS — R110 Nausea: Secondary | ICD-10-CM | POA: Insufficient documentation

## 2013-07-10 DIAGNOSIS — R51 Headache: Secondary | ICD-10-CM | POA: Insufficient documentation

## 2013-07-10 DIAGNOSIS — M542 Cervicalgia: Secondary | ICD-10-CM | POA: Insufficient documentation

## 2013-07-10 NOTE — ED Notes (Signed)
Per pt sts 1 week of migraine and right sided neck pain that shoots into her ear. sts she has taken OTC meds without relief.

## 2013-07-11 MED ORDER — METOCLOPRAMIDE HCL 5 MG/ML IJ SOLN
10.0000 mg | Freq: Once | INTRAMUSCULAR | Status: AC
  Administered 2013-07-11: 10 mg via INTRAVENOUS
  Filled 2013-07-11: qty 2

## 2013-07-11 MED ORDER — DIPHENHYDRAMINE HCL 50 MG/ML IJ SOLN
25.0000 mg | Freq: Once | INTRAMUSCULAR | Status: AC
  Administered 2013-07-11: 25 mg via INTRAVENOUS
  Filled 2013-07-11: qty 1

## 2013-07-11 MED ORDER — DEXAMETHASONE SODIUM PHOSPHATE 10 MG/ML IJ SOLN
10.0000 mg | Freq: Once | INTRAMUSCULAR | Status: AC
  Administered 2013-07-11: 10 mg via INTRAVENOUS
  Filled 2013-07-11: qty 1

## 2013-07-11 NOTE — ED Provider Notes (Signed)
CSN: [id number]     Arrival date & time 07/10/13  1744 History   First MD Initiated Contact with Patient 07/10/13 2358     Chief Complaint  Patient presents with  . Migraine   (Consider location/radiation/quality/duration/timing/severity/associated sxs/prior Treatment) HPI 32 year old female presents to emergency room with complaint of one week of headache, and right-sided neck pain, radiating into her right ear.  Patient has history of migraines, reports this headache is similar to her migraines.  She reports photophobia, nausea.  Migraine started first, neck pain, started a few days later.  No fever no chills.  No nuchal rigidity.  She has taken ibuprofen, Excedrin Migraine, Tylenol, Alka-Seltzer without improvement.  She reports cold symptoms about 2 weeks ago.  Patient does not have a primary care Dr. History reviewed. No pertinent past medical history. Past Surgical History  Procedure Laterality Date  . Cesarean section     History reviewed. No pertinent family history. History  Substance Use Topics  . Smoking status: Never Smoker   . Smokeless tobacco: Not on file  . Alcohol Use: No     Comment: socially   OB History   Grav Para Term Preterm Abortions TAB SAB Ect Mult Living                 Review of Systems  See History of Present Illness; otherwise all other systems are reviewed and negative Allergies  Aleve  Home Medications   Current Outpatient Rx  Name  Route  Sig  Dispense  Refill  . acetaminophen (TYLENOL) 500 MG tablet   Oral   Take 1,000 mg by mouth daily as needed (for headache).         Marland Kitchen aspirin-acetaminophen-caffeine (EXCEDRIN MIGRAINE) 250-250-65 MG per tablet   Oral   Take 2 tablets by mouth daily as needed (for migraine).         Marland Kitchen aspirin-sod bicarb-citric acid (ALKA-SELTZER) 325 MG TBEF tablet   Oral   Take 325 mg by mouth every 6 (six) hours as needed (for sinus congestion).         Marland Kitchen ibuprofen (ADVIL,MOTRIN) 200 MG tablet   Oral    Take 400 mg by mouth daily as needed for headache.          BP 136/80  Pulse 92  Temp(Src) 98.7 F (37.1 C) (Oral)  Resp 18  Ht 5\' 8"  (1.727 m)  Wt 215 lb (97.523 kg)  BMI 32.7 kg/m2  SpO2 100%  LMP 07/07/2013 Physical Exam  Nursing note and vitals reviewed. Constitutional: She is oriented to person, place, and time. She appears well-developed and well-nourished.  HENT:  Head: Normocephalic and atraumatic.  Right Ear: External ear normal.  Left Ear: External ear normal.  Nose: Nose normal.  Mouth/Throat: Oropharynx is clear and moist.  Eyes: Conjunctivae and EOM are normal. Pupils are equal, round, and reactive to light.  Neck: Normal range of motion. Neck supple. No JVD present. No tracheal deviation present. No thyromegaly present.  Cardiovascular: Normal rate, regular rhythm, normal heart sounds and intact distal pulses.  Exam reveals no gallop and no friction rub.   No murmur heard. Pulmonary/Chest: Effort normal and breath sounds normal. No stridor. No respiratory distress. She has no wheezes. She has no rales. She exhibits no tenderness.  Abdominal: Soft. Bowel sounds are normal. She exhibits no distension and no mass. There is no tenderness. There is no rebound and no guarding.  Musculoskeletal: Normal range of motion. She exhibits no edema and no  tenderness.  Lymphadenopathy:    She has no cervical adenopathy.  Neurological: She is alert and oriented to person, place, and time. She has normal reflexes. No cranial nerve deficit. She exhibits normal muscle tone. Coordination normal.  Skin: Skin is warm and dry. No rash noted. No erythema. No pallor.  Psychiatric: She has a normal mood and affect. Her behavior is normal. Judgment and thought content normal.    ED Course  Procedures (including critical care time) Labs Review Labs Reviewed - No data to display Imaging Review No results found.  EKG Interpretation   None       MDM   1. Headache   2. Neck pain     32 year old female with a week of migraine headache.  It also appears that she has some muscle spasm/tension headache component as well.  Will treat with migraine cocktail.  Patient is anxious to get home.    Olivia Mackie, MD 07/11/13 [phone]

## 2013-12-05 ENCOUNTER — Encounter (HOSPITAL_COMMUNITY): Payer: Self-pay | Admitting: Emergency Medicine

## 2013-12-05 ENCOUNTER — Emergency Department (HOSPITAL_COMMUNITY)
Admission: EM | Admit: 2013-12-05 | Discharge: 2013-12-05 | Disposition: A | Discharge status: Home / Self Care | Payer: No Typology Code available for payment source | Source: Home / Self Care | Attending: Family Medicine | Admitting: Family Medicine

## 2013-12-05 DIAGNOSIS — B9562 Methicillin resistant Staphylococcus aureus infection as the cause of diseases classified elsewhere: Secondary | ICD-10-CM

## 2013-12-05 DIAGNOSIS — A4902 Methicillin resistant Staphylococcus aureus infection, unspecified site: Secondary | ICD-10-CM

## 2013-12-05 DIAGNOSIS — L0390 Cellulitis, unspecified: Principal | ICD-10-CM

## 2013-12-05 DIAGNOSIS — L0291 Cutaneous abscess, unspecified: Secondary | ICD-10-CM

## 2013-12-05 MED ORDER — MINOCYCLINE HCL 100 MG PO CAPS: 100.0000 mg | ORAL_CAPSULE | Freq: Two times a day (BID) | ORAL | Status: DC

## 2013-12-05 NOTE — ED Notes (Signed)
Patient complains of insect bite that is now swollen tender with redness; states fever chills that started late Friday.

## 2013-12-05 NOTE — Discharge Instructions (Signed)
Warm compress twice a day when you take the antibiotic, take all of medicine, return as needed. °

## 2013-12-05 NOTE — ED Provider Notes (Signed)
CSN: [id number]     Arrival date & time 12/05/13  1855 History   First MD Initiated Contact with Patient 12/05/13 1955     Chief Complaint  Patient presents with  . Insect Bite   (Consider location/radiation/quality/duration/timing/severity/associated sxs/prior Treatment) Patient is a 33 y.o. female presenting with abscess. The history is provided by the patient.  Abscess Location:  Leg Leg abscess location:  L upper leg Abscess quality: induration, painful and redness   Abscess quality: not draining and no fluctuance   Red streaking: no   Duration:  4 days Pain details:    Quality:  Throbbing   Severity:  Moderate   Progression:  Worsening Associated symptoms: no fever   Risk factors: prior abscess     History reviewed. No pertinent past medical history. Past Surgical History  Procedure Laterality Date  . Cesarean section     No family history on file. History  Substance Use Topics  . Smoking status: Never Smoker   . Smokeless tobacco: Not on file  . Alcohol Use: No     Comment: socially   OB History   Grav Para Term Preterm Abortions TAB SAB Ect Mult Living                 Review of Systems  Constitutional: Negative.  Negative for fever.  Musculoskeletal: Negative.   Skin: Positive for rash.    Allergies  Aleve  Home Medications   Current Outpatient Rx  Name  Route  Sig  Dispense  Refill  . acetaminophen (TYLENOL) 500 MG tablet   Oral   Take 1,000 mg by mouth daily as needed (for headache).         Marland Kitchen. aspirin-acetaminophen-caffeine (EXCEDRIN MIGRAINE) 250-250-65 MG per tablet   Oral   Take 2 tablets by mouth daily as needed (for migraine).         Marland Kitchen. aspirin-sod bicarb-citric acid (ALKA-SELTZER) 325 MG TBEF tablet   Oral   Take 325 mg by mouth every 6 (six) hours as needed (for sinus congestion).         Marland Kitchen. ibuprofen (ADVIL,MOTRIN) 200 MG tablet   Oral   Take 400 mg by mouth daily as needed for headache.          BP 147/83  Pulse 82   Temp(Src) 98.1 F (36.7 C) (Oral)  Resp 22  SpO2 100%  LMP 11/29/2013 Physical Exam  Nursing note and vitals reviewed. Constitutional: She is oriented to person, place, and time. She appears well-developed and well-nourished.  Neurological: She is alert and oriented to person, place, and time.  Skin: Skin is warm and dry. Rash noted.  Tender pustular lesion on left thigh,local erythema.    ED Course  INCISION AND DRAINAGE Date/Time: 12/05/2013 8:23 PM Performed by: Linna HoffKINDL, Jennipher Weatherholtz D Authorized by: Bradd CanaryKINDL, Shayden Gingrich D Consent: Verbal consent obtained. Risks and benefits: risks, benefits and alternatives were discussed Consent given by: patient Type: abscess Body area: lower extremity Location details: left leg Patient sedated: no Scalpel size: 11 Incision type: single straight Complexity: simple Drainage: purulent Drainage amount: scant Wound treatment: wound left open Patient tolerance: Patient tolerated the procedure well with no immediate complications. Comments: Culture obtained.   (including critical care time) Labs Review Labs Reviewed  CULTURE, ROUTINE-ABSCESS   Imaging Review No results found.   MDM   1. MRSA cellulitis        Linna HoffJames D Tyde Lamison, MD 12/05/13 2026

## 2013-12-08 LAB — CULTURE, ROUTINE-ABSCESS: SPECIAL REQUESTS: NORMAL

## 2013-12-08 NOTE — ED Notes (Signed)
Abscess culture L thigh: Mod. Staph. Aureus.  Pt. adequately treated with I and D and Minocycline. Vassie MoselleYork, Amie Cowens M 12/08/2013

## 2014-03-31 ENCOUNTER — Emergency Department (HOSPITAL_COMMUNITY): Payer: No Typology Code available for payment source

## 2014-03-31 ENCOUNTER — Encounter (HOSPITAL_COMMUNITY): Payer: Self-pay | Admitting: Emergency Medicine

## 2014-03-31 ENCOUNTER — Emergency Department (HOSPITAL_COMMUNITY)
Admission: EM | Admit: 2014-03-31 | Discharge: 2014-03-31 | Disposition: A | Discharge status: Home / Self Care | Payer: No Typology Code available for payment source | Attending: Emergency Medicine | Admitting: Emergency Medicine

## 2014-03-31 DIAGNOSIS — Z79899 Other long term (current) drug therapy: Secondary | ICD-10-CM | POA: Insufficient documentation

## 2014-03-31 DIAGNOSIS — J40 Bronchitis, not specified as acute or chronic: Secondary | ICD-10-CM | POA: Insufficient documentation

## 2014-03-31 DIAGNOSIS — G43909 Migraine, unspecified, not intractable, without status migrainosus: Secondary | ICD-10-CM | POA: Insufficient documentation

## 2014-03-31 HISTORY — DX: Migraine, unspecified, not intractable, without status migrainosus: G43.909

## 2014-03-31 LAB — CBC
HEMATOCRIT: 38.4 % (ref 36.0–46.0)
Hemoglobin: 12.6 g/dL (ref 12.0–15.0)
MCH: 31.4 pg (ref 26.0–34.0)
MCHC: 32.8 g/dL (ref 30.0–36.0)
MCV: 95.8 fL (ref 78.0–100.0)
PLATELETS: 296 10*3/uL (ref 150–400)
RBC: 4.01 MIL/uL (ref 3.87–5.11)
RDW: 12.6 % (ref 11.5–15.5)
WBC: 6.5 10*3/uL (ref 4.0–10.5)

## 2014-03-31 LAB — BASIC METABOLIC PANEL
Anion gap: 15 (ref 5–15)
BUN: 9 mg/dL (ref 6–23)
CO2: 24 mEq/L (ref 19–32)
Calcium: 9.1 mg/dL (ref 8.4–10.5)
Chloride: 101 mEq/L (ref 96–112)
Creatinine, Ser: 0.7 mg/dL (ref 0.50–1.10)
Glucose, Bld: 97 mg/dL (ref 70–99)
POTASSIUM: 4.2 meq/L (ref 3.7–5.3)
SODIUM: 140 meq/L (ref 137–147)

## 2014-03-31 LAB — TROPONIN I: Troponin I: <0.3 ng/mL (ref <0.30)

## 2014-03-31 LAB — POC URINE PREG, ED: PREG TEST UR: NEGATIVE

## 2014-03-31 MED ORDER — BENZONATATE 100 MG PO CAPS
200.0000 mg | ORAL_CAPSULE | Freq: Once | ORAL | Status: AC
  Administered 2014-03-31: 200 mg via ORAL
  Filled 2014-03-31: qty 2

## 2014-03-31 MED ORDER — TRAMADOL HCL 50 MG PO TABS: 50.0000 mg | ORAL_TABLET | Freq: Four times a day (QID) | ORAL | Status: DC | PRN

## 2014-03-31 MED ORDER — BENZONATATE 100 MG PO CAPS: 100.0000 mg | ORAL_CAPSULE | Freq: Three times a day (TID) | ORAL | Status: DC

## 2014-03-31 MED ORDER — ALBUTEROL SULFATE HFA 108 (90 BASE) MCG/ACT IN AERS: 2.0000 | INHALATION_SPRAY | RESPIRATORY_TRACT | Status: DC | PRN

## 2014-03-31 MED ORDER — TRAMADOL HCL 50 MG PO TABS
50.0000 mg | ORAL_TABLET | Freq: Once | ORAL | Status: AC
  Administered 2014-03-31: 50 mg via ORAL
  Filled 2014-03-31: qty 1

## 2014-03-31 MED ORDER — PREDNISONE 20 MG PO TABS: ORAL_TABLET | ORAL | Status: DC

## 2014-03-31 NOTE — ED Provider Notes (Signed)
CSN: [id number]     Arrival date & time 03/31/14  2020 History   First MD Initiated Contact with Patient 03/31/14 2300     Chief Complaint  Patient presents with  . Chest Pain  . Migraine     (Consider location/radiation/quality/duration/timing/severity/associated sxs/prior Treatment) HPI Comments: 33 year old female, presents with 2 weeks of cough. This is occasionally productive, not associated with fevers or chills since last week when that result. She has no sore throat or runny nose but has a child with similar symptoms and her husband had tonsillitis 3 weeks ago. Nothing makes it better or worse, no medications given prior to arrival, she has an associated headache with left-sided neck pain which is sharp and stabbing and shoots from her shoulder to her left ear occasionally.  Patient is a 33 y.o. female presenting with chest pain and migraines. The history is provided by the patient.  Chest Pain Migraine Associated symptoms include chest pain.    Past Medical History  Diagnosis Date  . Migraines    Past Surgical History  Procedure Laterality Date  . Cesarean section    . Cesarean section     No family history on file. History  Substance Use Topics  . Smoking status: Never Smoker   . Smokeless tobacco: Never Used  . Alcohol Use: No     Comment: socially   OB History   Grav Para Term Preterm Abortions TAB SAB Ect Mult Living                 Review of Systems  Cardiovascular: Positive for chest pain.  All other systems reviewed and are negative.     Allergies  Aleve  Home Medications   Prior to Admission medications   Medication Sig Start Date End Date Taking? Authorizing Provider  acetaminophen (TYLENOL) 500 MG tablet Take 1,000 mg by mouth 2 (two) times daily as needed for headache (migraines).    Yes Historical Provider, MD  aspirin-acetaminophen-caffeine (EXCEDRIN MIGRAINE) [id number] MG per tablet Take 2 tablets by mouth daily as needed for migraine.     Yes Historical Provider, MD  ibuprofen (ADVIL,MOTRIN) 200 MG tablet Take 800 mg by mouth 2 (two) times daily as needed for headache (migraines).    Yes Historical Provider, MD  Olopatadine HCl (PATADAY) 0.2 % SOLN Place 1 drop into both eyes daily.   Yes Historical Provider, MD  albuterol (PROVENTIL HFA;VENTOLIN HFA) 108 (90 BASE) MCG/ACT inhaler Inhale 2 puffs into the lungs every 4 (four) hours as needed for wheezing or shortness of breath. 03/31/14   Vida RollerBrian D Tianne Plott, MD  benzonatate (TESSALON) 100 MG capsule Take 1 capsule (100 mg total) by mouth every 8 (eight) hours. 03/31/14   Vida RollerBrian D Sahith Nurse, MD  predniSONE (DELTASONE) 20 MG tablet One tablet once daily for 5 days 03/31/14   Vida RollerBrian D Brevin Mcfadden, MD  traMADol (ULTRAM) 50 MG tablet Take 1 tablet (50 mg total) by mouth every 6 (six) hours as needed. 03/31/14   Vida RollerBrian D Buffey Zabinski, MD   BP 115/77  Pulse 67  Temp(Src) 98.5 F (36.9 C) (Oral)  Resp 20  Ht 5\' 9"  (1.753 m)  Wt 196 lb (88.905 kg)  BMI 28.93 kg/m2  SpO2 99%  LMP 03/28/2014 Physical Exam  Nursing note and vitals reviewed. Constitutional: She appears well-developed and well-nourished. No distress.  HENT:  Head: Normocephalic and atraumatic.  Mouth/Throat: Oropharynx is clear and moist. No oropharyngeal exudate.  Eyes: Conjunctivae and EOM are normal. Pupils are equal, round, and  reactive to light. Right eye exhibits no discharge. Left eye exhibits no discharge. No scleral icterus.  Neck: Normal range of motion. Neck supple. No JVD present. No thyromegaly present.  Cardiovascular: Normal rate, regular rhythm, normal heart sounds and intact distal pulses.  Exam reveals no gallop and no friction rub.   No murmur heard. Pulmonary/Chest: Effort normal and breath sounds normal. No respiratory distress. She has no wheezes. She has no rales.  Abdominal: Soft. Bowel sounds are normal. She exhibits no distension and no mass. There is no tenderness.  Musculoskeletal: Normal range of motion. She  exhibits no edema and no tenderness.  Lymphadenopathy:    She has no cervical adenopathy.  Neurological: She is alert. Coordination normal.  Skin: Skin is warm and dry. No rash noted. No erythema.  Psychiatric: She has a normal mood and affect. Her behavior is normal.    ED Course  Procedures (including critical care time) Labs Review Labs Reviewed  CBC  BASIC METABOLIC PANEL  TROPONIN I  POC URINE PREG, ED    Imaging Review Dg Chest 2 View  03/31/2014   CLINICAL DATA:  Chest pain with migraines, cough, congestion for 1 week  EXAM: CHEST  2 VIEW  COMPARISON:  None.  FINDINGS: The heart size and mediastinal contours are within normal limits. Both lungs are clear. The visualized skeletal structures are unremarkable.  IMPRESSION: No active cardiopulmonary disease.   Electronically Signed   By: Elige Ko   On: 03/31/2014 21:31     EKG Interpretation   Date/Time:  Saturday March 31 2014 20:22:34 EDT Ventricular Rate:  88 PR Interval:  156 QRS Duration: 80 QT Interval:  346 QTC Calculation: 418 R Axis:   47 Text Interpretation:  Normal sinus rhythm with sinus arrhythmia Low  voltage QRS Borderline ECG No old tracing to compare Confirmed by Lanis Storlie   MD, Siham Bucaro ([id number]) on 03/31/2014 11:37:09 PM      MDM   Final diagnoses:  Bronchitis    The patient is clear heart and lung sounds, no murmurs rubs or gallops, no wheezes rales or rhonchi and no increased work of breathing. In fact the patient was sleeping on my entry into the room, she states that she has medical insurance that can help pay for an inhaler, will prescribe a cough medication and a pain medication for her neck and headache which is likely related to her cough. This is most likely viral given her family members with similar symptoms over the past couple of weeks, stable for discharge.  EKG unremarkable, doubt cardiac source of chest pain, likely related to bronchitis and coughing.  Meds given in ED:  Medications   benzonatate (TESSALON) capsule 200 mg (not administered)  traMADol (ULTRAM) tablet 50 mg (not administered)    New Prescriptions   ALBUTEROL (PROVENTIL HFA;VENTOLIN HFA) 108 (90 BASE) MCG/ACT INHALER    Inhale 2 puffs into the lungs every 4 (four) hours as needed for wheezing or shortness of breath.   BENZONATATE (TESSALON) 100 MG CAPSULE    Take 1 capsule (100 mg total) by mouth every 8 (eight) hours.   PREDNISONE (DELTASONE) 20 MG TABLET    One tablet once daily for 5 days   TRAMADOL (ULTRAM) 50 MG TABLET    Take 1 tablet (50 mg total) by mouth every 6 (six) hours as needed.        Vida Roller, MD 03/31/14 [phone]

## 2014-03-31 NOTE — ED Notes (Signed)
Patient states she has been having pain in her neck and today she started with chest pain and headache

## 2014-03-31 NOTE — ED Notes (Signed)
Pt reports migraine, neck pain, and generalized cp. Pt states cp started today. Denies any n/v, sob or any other associated sx with cp. Reports cp is sometimes a sharp pain. Rates pain 7/10. Pt states she takes excedrine migraine and it usually works, she last took some yesterday at [employer]9am.

## 2014-03-31 NOTE — ED Notes (Signed)
Discharge instructions reviewed with pt. Pt verbalized understanding.   

## 2014-09-06 ENCOUNTER — Encounter (HOSPITAL_COMMUNITY): Payer: Self-pay | Admitting: *Deleted

## 2014-09-06 ENCOUNTER — Emergency Department (HOSPITAL_COMMUNITY)
Admission: EM | Admit: 2014-09-06 | Discharge: 2014-09-06 | Disposition: A | Discharge status: Home / Self Care | Payer: No Typology Code available for payment source | Attending: Emergency Medicine | Admitting: Emergency Medicine

## 2014-09-06 DIAGNOSIS — Y9389 Activity, other specified: Secondary | ICD-10-CM | POA: Insufficient documentation

## 2014-09-06 DIAGNOSIS — T781XXA Other adverse food reactions, not elsewhere classified, initial encounter: Secondary | ICD-10-CM | POA: Insufficient documentation

## 2014-09-06 DIAGNOSIS — R220 Localized swelling, mass and lump, head: Secondary | ICD-10-CM | POA: Diagnosis not present

## 2014-09-06 DIAGNOSIS — Z79899 Other long term (current) drug therapy: Secondary | ICD-10-CM | POA: Diagnosis not present

## 2014-09-06 DIAGNOSIS — G43909 Migraine, unspecified, not intractable, without status migrainosus: Secondary | ICD-10-CM | POA: Insufficient documentation

## 2014-09-06 DIAGNOSIS — Y998 Other external cause status: Secondary | ICD-10-CM | POA: Diagnosis not present

## 2014-09-06 DIAGNOSIS — R0602 Shortness of breath: Secondary | ICD-10-CM | POA: Insufficient documentation

## 2014-09-06 DIAGNOSIS — Y9289 Other specified places as the place of occurrence of the external cause: Secondary | ICD-10-CM | POA: Insufficient documentation

## 2014-09-06 DIAGNOSIS — H5713 Ocular pain, bilateral: Secondary | ICD-10-CM | POA: Diagnosis present

## 2014-09-06 DIAGNOSIS — T7840XA Allergy, unspecified, initial encounter: Secondary | ICD-10-CM

## 2014-09-06 MED ORDER — DIPHENHYDRAMINE HCL 50 MG/ML IJ SOLN
25.0000 mg | Freq: Once | INTRAMUSCULAR | Status: AC
  Administered 2014-09-06: 25 mg via INTRAVENOUS
  Filled 2014-09-06: qty 1

## 2014-09-06 MED ORDER — ONDANSETRON HCL 4 MG/2ML IJ SOLN
4.0000 mg | Freq: Once | INTRAMUSCULAR | Status: AC
  Administered 2014-09-06: 4 mg via INTRAVENOUS
  Filled 2014-09-06: qty 2

## 2014-09-06 MED ORDER — PREDNISONE 10 MG PO TABS: ORAL_TABLET | ORAL | Status: DC

## 2014-09-06 MED ORDER — EPINEPHRINE 0.3 MG/0.3ML IJ SOAJ: 0.3000 mg | Freq: Once | INTRAMUSCULAR | Status: AC

## 2014-09-06 MED ORDER — METHYLPREDNISOLONE SODIUM SUCC 125 MG IJ SOLR
125.0000 mg | Freq: Once | INTRAMUSCULAR | Status: AC
  Administered 2014-09-06: 125 mg via INTRAVENOUS
  Filled 2014-09-06: qty 2

## 2014-09-06 MED ORDER — FAMOTIDINE IN NACL 20-0.9 MG/50ML-% IV SOLN
20.0000 mg | Freq: Once | INTRAVENOUS | Status: AC
  Administered 2014-09-06: 20 mg via INTRAVENOUS
  Filled 2014-09-06: qty 50

## 2014-09-06 NOTE — ED Notes (Signed)
States,"believes she is having an allergic reaction;" had pineapples last night. But, states, "bit in eye; there is left eye swelling." states, "hard to swallow." no resp. Distress in triage."

## 2014-09-06 NOTE — ED Provider Notes (Signed)
CSN: [id number]     Arrival date & time 09/06/14  [id number] History   First MD Initiated Contact with Patient 09/06/14 [phone]     Chief Complaint  Patient presents with  . Allergic Reaction  . Facial Swelling  . Eye Pain     (Consider location/radiation/quality/duration/timing/severity/associated sxs/prior Treatment) HPI Comments: Pt comes in today with complaint of allergic reaction that started last night after having pineapple. Pt states that she tried a dose of benadryl this morning with mild relief. Pt states that she had swelling to eyes and sob. She states that the symptoms seemed worse this morning, with some swelling noted to her upper lip. Pt states that over the last couple of months she has been having allergic reactions sometimes with hive and sometimes with swelling. Pt has not been to the allergist at this time.  The history is provided by the patient. No language interpreter was used.    Past Medical History  Diagnosis Date  . Migraines    Past Surgical History  Procedure Laterality Date  . Cesarean section    . Cesarean section     History reviewed. No pertinent family history. History  Substance Use Topics  . Smoking status: Never Smoker   . Smokeless tobacco: Never Used  . Alcohol Use: No     Comment: socially   OB History    No data available     Review of Systems  All other systems reviewed and are negative.     Allergies  Aleve  Home Medications   Prior to Admission medications   Medication Sig Start Date End Date Taking? Authorizing Provider  acetaminophen (TYLENOL) 500 MG tablet Take 1,000 mg by mouth 2 (two) times daily as needed for headache (migraines).    Yes Historical Provider, MD  albuterol (PROVENTIL HFA;VENTOLIN HFA) 108 (90 BASE) MCG/ACT inhaler Inhale 2 puffs into the lungs every 4 (four) hours as needed for wheezing or shortness of breath. 03/31/14  Yes Vida RollerBrian D Miller, MD  aspirin-acetaminophen-caffeine (EXCEDRIN MIGRAINE) [id number]  MG per tablet Take 2 tablets by mouth daily as needed for migraine.    Yes Historical Provider, MD  ibuprofen (ADVIL,MOTRIN) 200 MG tablet Take 800 mg by mouth 2 (two) times daily as needed for headache (migraines).    Yes Historical Provider, MD  Olopatadine HCl (PATADAY) 0.2 % SOLN Place 1 drop into both eyes daily.   Yes Historical Provider, MD  benzonatate (TESSALON) 100 MG capsule Take 1 capsule (100 mg total) by mouth every 8 (eight) hours. Patient not taking: Reported on 09/06/2014 03/31/14   Vida RollerBrian D Miller, MD  predniSONE (DELTASONE) 20 MG tablet One tablet once daily for 5 days Patient not taking: Reported on 09/06/2014 03/31/14   Vida RollerBrian D Miller, MD  traMADol (ULTRAM) 50 MG tablet Take 1 tablet (50 mg total) by mouth every 6 (six) hours as needed. Patient not taking: Reported on 09/06/2014 03/31/14   Vida RollerBrian D Miller, MD   BP 126/72 mmHg  Pulse 82  Temp(Src) 98 F (36.7 C) (Oral)  Resp 15  SpO2 100%  LMP 08/16/2014 Physical Exam  Constitutional: She is oriented to person, place, and time. She appears well-developed and well-nourished.  HENT:  Right Ear: External ear normal.  Left Ear: External ear normal.  Mouth/Throat: Oropharynx is clear and moist.  Swelling to the right upper lip and to eyes bilaterally. Hoarse voice. No oral mucosal swelling  Cardiovascular: Normal rate and regular rhythm.   Pulmonary/Chest: Effort normal and breath sounds  normal. No respiratory distress. She has no wheezes.  Musculoskeletal: Normal range of motion.  Neurological: She is alert and oriented to person, place, and time.  Skin: Skin is warm and dry.  Psychiatric: She has a normal mood and affect.  Nursing note and vitals reviewed.   ED Course  Procedures (including critical care time) Labs Review Labs Reviewed - No data to display  Imaging Review No results found.   EKG Interpretation None      MDM   Final diagnoses:  Allergic reaction, initial encounter    Pt is doing better  with all the facial swelling resolved. Discussed follow up with allergist. Will send home on prednisone and epipen    Teressa LowerVrinda Verna Hamon, NP 09/06/14 1347  Purvis SheffieldForrest Harrison, MD 09/06/14 2038

## 2014-09-06 NOTE — Discharge Instructions (Signed)
As discussed you need to follow up with the allergic. Epinephrine Injection Epinephrine is a medicine given by injection to temporarily treat an emergency allergic reaction. It is also used to treat severe asthmatic attacks and other lung problems. The medicine helps to enlarge (dilate) the small breathing tubes of the lungs. A life-threatening, sudden allergic reaction that involves the whole body is called anaphylaxis. Because of potential side effects, epinephrine should only be used as directed by your caregiver. RISKS AND COMPLICATIONS Possible side effects of epinephrine injections include:  Chest pain.  Irregular or rapid heartbeat.  Shortness of breath.  Nausea.  Vomiting.  Abdominal pain or cramping.  Sweating.  Dizziness.  Weakness.  Headache.  Nervousness. Report all side effects to your caregiver. HOW TO GIVE AN EPINEPHRINE INJECTION Give the epinephrine injection immediately when symptoms of a severe reaction begin. Inject the medicine into the outer thigh or any available, large muscle. Your caregiver can teach you how to do this. You do not need to remove any clothing. After the injection, call your local emergency services (911 in [place] Even if you improve after the injection, you need to be examined at a hospital emergency department. Epinephrine works quickly, but it also wears off quickly. Delayed reactions can occur. A delayed reaction may be as serious and dangerous as the initial reaction. HOME CARE INSTRUCTIONS  Make sure you and your family know how to give an epinephrine injection.  Use epinephrine injections as directed by your caregiver. Do not use this medicine more often or in larger doses than prescribed.  Always carry your epinephrine injection or anaphylaxis kit with you. This can be lifesaving if you have a severe reaction.  Store the medicine in a cool, dry place. If the medicine becomes discolored or cloudy, dispose of it properly and replace  it with new medicine.  Check the expiration date on your medicine. It may be unsafe to use medicines past their expiration date.  Tell your caregiver about any other medicines you are taking. Some medicines can react badly with epinephrine.  Tell your caregiver about any medical conditions you have, such as diabetes, high blood pressure (hypertension), heart disease, irregular heartbeats, or if you are pregnant. SEEK IMMEDIATE MEDICAL CARE IF:  You have used an epinephrine injection. Call your local emergency services (911 in [place] Even if you improve after the injection, you need to be examined at a hospital emergency department to make sure your allergic reaction is under control. You will also be monitored for adverse effects from the medicine.  You have chest pain.  You have irregular or fast heartbeats.  You have shortness of breath.  You have severe headaches.  You have severe nausea, vomiting, or abdominal cramps.  You have severe pain, swelling, or redness in the area where you gave the injection. Document Released: 08/28/2000 Document Revised: 11/23/2011 Document Reviewed: 05/20/2011 [employer] Patient Information 2015 [place], [place]. This information is not intended to replace advice given to you by your health care provider. Make sure you discuss any questions you have with your health care provider.  Food Allergy A food allergy causes your body to have a strange reaction after you eat or drink certain foods or drinks. Allergic reactions can cause puffiness (swelling) and itchy, red rashes and hives. Sometimes you will throw up (vomit) or have watery poop (diarrhea). Severe allergic reactions can be life-threatening. These reactions can make it hard to breathe or swallow. HOME CARE If you do not know what caused your  allergic reaction:  Write down the foods and drinks you had before the reaction.  Write down any problems you had.  Stop eating or drinking things that cause  you to have a reaction. If you have hives or a rash:  Take medicine as told by your doctor.  Place cold cloths on your skin.  Take baths in cool water.  Do not take hot baths or showers. If you are severely allergic:  Wear a medical bracelet or necklace that lists your allergy.  Carry your allergy kit or medicine shot to treat severe allergic reactions with you. These can save your life.  Carry backup medicine shots. You can have a delayed reaction after the medicine from your first shot wears off. This can be just as serious as the first reaction.  Do not drive until medicine from your shot has worn off, unless your doctor says it is okay. GET HELP RIGHT AWAY IF:   You have trouble breathing or you are wheezing.  You have a tight feeling in your chest or throat.  You have puffiness around your mouth.  You have hives, puffiness, or itching all over your body.  You think you are having an allergic reaction. Problems usually start within 30 minutes after eating a food you are allergic to.  Your problems are not better after 2 days.  You have new problems.  Your problems come back. MAKE SURE YOU:   Understand these instructions.  Will watch your condition.  Will get help right away if you are not doing well or get worse. Document Released: 02/18/2010 Document Revised: 11/23/2011 Document Reviewed: 02/18/2010 [facility] Patient Information 2015 [place], [place]. This information is not intended to replace advice given to you by your health care provider. Make sure you discuss any questions you have with your health care provider.

## 2015-02-03 ENCOUNTER — Encounter (HOSPITAL_COMMUNITY): Payer: Self-pay | Admitting: *Deleted

## 2015-02-03 ENCOUNTER — Inpatient Hospital Stay (HOSPITAL_COMMUNITY)
Admission: AD | Admit: 2015-02-03 | Discharge: 2015-02-04 | Disposition: A | Discharge status: Home / Self Care | Payer: No Typology Code available for payment source | Source: Ambulatory Visit | Attending: Obstetrics & Gynecology | Admitting: Obstetrics & Gynecology

## 2015-02-03 DIAGNOSIS — O9989 Other specified diseases and conditions complicating pregnancy, childbirth and the puerperium: Secondary | ICD-10-CM | POA: Insufficient documentation

## 2015-02-03 DIAGNOSIS — R51 Headache: Secondary | ICD-10-CM | POA: Insufficient documentation

## 2015-02-03 DIAGNOSIS — Z3A01 Less than 8 weeks gestation of pregnancy: Secondary | ICD-10-CM | POA: Diagnosis not present

## 2015-02-03 DIAGNOSIS — O26891 Other specified pregnancy related conditions, first trimester: Secondary | ICD-10-CM

## 2015-02-03 DIAGNOSIS — G43909 Migraine, unspecified, not intractable, without status migrainosus: Secondary | ICD-10-CM | POA: Diagnosis present

## 2015-02-03 DIAGNOSIS — Z8759 Personal history of other complications of pregnancy, childbirth and the puerperium: Secondary | ICD-10-CM

## 2015-02-03 DIAGNOSIS — Z8669 Personal history of other diseases of the nervous system and sense organs: Secondary | ICD-10-CM

## 2015-02-03 HISTORY — DX: Unspecified abnormal cytological findings in specimens from vagina: R87.629

## 2015-02-03 LAB — URINALYSIS, ROUTINE W REFLEX MICROSCOPIC
Bilirubin Urine: NEGATIVE
GLUCOSE, UA: NEGATIVE mg/dL
HGB URINE DIPSTICK: NEGATIVE
Ketones, ur: NEGATIVE mg/dL
LEUKOCYTES UA: NEGATIVE
NITRITE: NEGATIVE
PH: 5.5 (ref 5.0–8.0)
PROTEIN: NEGATIVE mg/dL
Specific Gravity, Urine: 1.015 (ref 1.005–1.030)
Urobilinogen, UA: 0.2 mg/dL (ref 0.0–1.0)

## 2015-02-03 LAB — POCT PREGNANCY, URINE: Preg Test, Ur: POSITIVE — AB

## 2015-02-03 MED ORDER — TRAMADOL HCL 50 MG PO TABS
50.0000 mg | ORAL_TABLET | Freq: Once | ORAL | Status: AC
  Administered 2015-02-03: 50 mg via ORAL
  Filled 2015-02-03: qty 1

## 2015-02-03 MED ORDER — BUTALBITAL-APAP-CAFFEINE 50-325-40 MG PO TABS: 2.0000 | ORAL_TABLET | Freq: Once | ORAL | Status: DC

## 2015-02-03 MED ORDER — TRAMADOL HCL 50 MG PO TABS
50.0000 mg | ORAL_TABLET | Freq: Once | ORAL | Status: AC | PRN
  Filled 2015-02-03: qty 1

## 2015-02-03 NOTE — MAU Provider Note (Signed)
Chief Complaint: No chief complaint on file.   First Provider Initiated Contact with Patient 02/03/15 2214     SUBJECTIVE HPI: [name] Fry is a 34 y.o. [id number] at [redacted]w[redacted]d by LMP who presents to [facility] reporting migraine HA x 1 week, same as chronic migraines. States they usually get worse during pregnancy. Rates HA 10/10, but states it is not the worst headache of her life. Hasn't tried anything for the pain because she states she is allergic to all pain medication. Hasn't tried any comfort measures except for lying on her stomach which helps. Doesn't see a primary care provider or neurologist for chronic migraines.  Upon review of her chart she was given Ultram July 2015. Asked if she had allergic reaction to that and she stated she did not.  Denies fever, chills, abdominal pain or vaginal bleeding.  Past Medical History  Diagnosis Date  . Migraines   . Vaginal Pap smear, abnormal    OB History  Gravida Para Term Preterm AB SAB TAB Ectopic Multiple Living  # Outcome Date GA Lbr Len/2nd Weight Sex Delivery Anes PTL Lv  6 Current           5 SAB           4 SAB           3 Preterm           2 Term           1 Term              Past Surgical History  Procedure Laterality Date  . Cesarean section    . Cesarean section    . Wisdom tooth extraction     History   Social History  . Marital Status: Married    Spouse Name: N/A  . Number of Children: N/A  . Years of Education: N/A   Occupational History  . Not on file.   Social History Main Topics  . Smoking status: Never Smoker   . Smokeless tobacco: Never Used  . Alcohol Use: No     Comment: socially  . Drug Use: No  . Sexual Activity: Yes    Birth Control/ Protection: None   Other Topics Concern  . Not on file   Social History Narrative   No current facility-administered medications on file prior to encounter.   Current Outpatient Prescriptions on File Prior to Encounter   Medication Sig Dispense Refill  . albuterol (PROVENTIL HFA;VENTOLIN HFA) 108 (90 BASE) MCG/ACT inhaler Inhale 2 puffs into the lungs every 4 (four) hours as needed for wheezing or shortness of breath. 1 Inhaler 3  . EPINEPHrine (EPIPEN 2-PAK) 0.3 mg/0.3 mL IJ SOAJ injection Inject 0.3 mLs (0.3 mg total) into the muscle once. (Patient taking differently: Inject 0.3 mg into the muscle once as needed (for allergic reaction). ) 1 Device 1  . Olopatadine HCl (PATADAY) 0.2 % SOLN Place 1 drop into both eyes daily.    . benzonatate (TESSALON) 100 MG capsule Take 1 capsule (100 mg total) by mouth every 8 (eight) hours. (Patient not taking: Reported on 09/06/2014) 21 capsule 0  . predniSONE (DELTASONE) 10 MG tablet 6 day step down dose (Patient not taking: Reported on 02/03/2015) 21 tablet 0  . traMADol (ULTRAM) 50 MG tablet Take 1 tablet (50 mg total) by mouth every 6 (six) hours as needed. (Patient not taking: Reported on 09/06/2014)  15 tablet 0   Allergies  Allergen Reactions  . Tylenol [Acetaminophen] Rash    And facial swelling  . Aleve [Naproxen Sodium] Swelling  . Other Hives, Swelling and Other (See Comments)    Pt states that she is allergic to all pain medications.  Able top take Ultram.   . Vicodin [Hydrocodone-Acetaminophen] Swelling    Facial swelling and rash    Review of Systems  Constitutional: Negative for fever and chills.  HENT: Negative for congestion and sore throat.   Eyes: Negative for blurred vision, double vision, photophobia and pain.  Gastrointestinal: Negative for abdominal pain.  Musculoskeletal: Negative for myalgias, falls and neck pain.  Neurological: Positive for headaches. Negative for dizziness, speech change, focal weakness, seizures, loss of consciousness and weakness.    OBJECTIVE Blood pressure 107/58, pulse 88, temperature 98.4 F (36.9 C), temperature source Oral, resp. rate 16, last menstrual period 12/28/2014. GENERAL: Well-developed, well-nourished  female in mild distress.  HEAD: Atraumatic. Sinuses nontender. No nasal congestion. No nuchal rigidity. HEART: normal rate RESP: normal effort GI: Deferred. MS: Nontender, no edema NEURO: Alert and oriented. Normal speech and gait. SPECULUM EXAM: Deferred  LAB RESULTS Results for orders placed or performed during the hospital encounter of 02/03/15 (from the past 24 hour(s))  Urinalysis, Routine w reflex microscopic     Status: None   Collection Time: 02/03/15  8:29 PM  Result Value Ref Range   Color, Urine YELLOW YELLOW   APPearance CLEAR CLEAR   Specific Gravity, Urine 1.015 1.005 - 1.030   pH 5.5 5.0 - 8.0   Glucose, UA NEGATIVE NEGATIVE mg/dL   Hgb urine dipstick NEGATIVE NEGATIVE   Bilirubin Urine NEGATIVE NEGATIVE   Ketones, ur NEGATIVE NEGATIVE mg/dL   Protein, ur NEGATIVE NEGATIVE mg/dL   Urobilinogen, UA 0.2 0.0 - 1.0 mg/dL   Nitrite NEGATIVE NEGATIVE   Leukocytes, UA NEGATIVE NEGATIVE  Pregnancy, urine POC     Status: Abnormal   Collection Time: 02/03/15  8:38 PM  Result Value Ref Range   Preg Test, Ur POSITIVE (A) NEGATIVE    IMAGING No results found.  MAU COURSE UPT, UA, Ultram. 50 mg given initially. No allergic reaction. Pain improved, patient requesting second tablet. Second tablet given.  Pain 5/10 on pain scale. Patient comfortable-appearing, stating she feels much better and is ready to go home.  ASSESSMENT 1. Headache in pregnancy, antepartum, first trimester   2. History of migraine during pregnancy     PLAN Discharge home in stable condition. Headache regular hikes are reviewed. Comfort measures. Discussed possible headache triggers. Use Ultram sparingly for severe headaches if comfort measures do not work. Encouraged to minimize medication use in pregnancy, especially first trimester. First trimester precautions.     Follow-up Information    Follow up with [facility].   Why:  Start prenatal care   Contact information:   [address], [address] [place] [phone]       [facility].   Why:  As needed in emergencies   Contact information:   [address] [id number] mc [place] [id number] [phone]      Follow up with [facility].   Why:  For management of chronic headaches   Contact information:   [address] [place] [id number] [phone]       Medication List    STOP taking these medications  benzonatate 100 MG capsule  Commonly known as:  TESSALON     predniSONE 10 MG tablet  Commonly known as:  DELTASONE      TAKE these medications        albuterol 108 (90 BASE) MCG/ACT inhaler  Commonly known as:  PROVENTIL HFA;VENTOLIN HFA  Inhale 2 puffs into the lungs every 4 (four) hours as needed for wheezing or shortness of breath.     CONCEPT OB 130-92.4-1 MG Caps  Take 1 tablet by mouth daily.     EPINEPHrine 0.3 mg/0.3 mL Soaj injection  Commonly known as:  EPIPEN 2-PAK  Inject 0.3 mLs (0.3 mg total) into the muscle once.     PATADAY 0.2 % Soln  Generic drug:  Olopatadine HCl  Place 1 drop into both eyes daily.     traMADol 50 MG tablet  Commonly known as:  ULTRAM  Take 1-2 tablets (50-100 mg total) by mouth every 6 (six) hours as needed for severe pain.       [place], CNM 02/04/2015  1:25 AM

## 2015-02-04 DIAGNOSIS — O26891 Other specified pregnancy related conditions, first trimester: Secondary | ICD-10-CM | POA: Diagnosis not present

## 2015-02-04 DIAGNOSIS — Z3A01 Less than 8 weeks gestation of pregnancy: Secondary | ICD-10-CM | POA: Diagnosis not present

## 2015-02-04 DIAGNOSIS — R51 Headache: Secondary | ICD-10-CM | POA: Diagnosis not present

## 2015-02-04 MED ORDER — CONCEPT OB 130-92.4-1 MG PO CAPS: 1.0000 | ORAL_CAPSULE | Freq: Every day | ORAL | Status: DC

## 2015-02-04 MED ORDER — TRAMADOL HCL 50 MG PO TABS
50.0000 mg | ORAL_TABLET | Freq: Once | ORAL | Status: AC
  Administered 2015-02-04: 50 mg via ORAL

## 2015-02-04 MED ORDER — TRAMADOL HCL 50 MG PO TABS: 50.0000 mg | ORAL_TABLET | Freq: Four times a day (QID) | ORAL | Status: DC | PRN

## 2015-02-04 NOTE — Discharge Instructions (Signed)

## 2015-02-22 ENCOUNTER — Inpatient Hospital Stay (HOSPITAL_COMMUNITY)
Admission: AD | Admit: 2015-02-22 | Discharge: 2015-02-23 | Disposition: A | Discharge status: Home / Self Care | Payer: No Typology Code available for payment source | Source: Ambulatory Visit | Attending: Obstetrics and Gynecology | Admitting: Obstetrics and Gynecology

## 2015-02-22 ENCOUNTER — Encounter (HOSPITAL_COMMUNITY): Payer: Self-pay

## 2015-02-22 ENCOUNTER — Inpatient Hospital Stay (HOSPITAL_COMMUNITY): Payer: No Typology Code available for payment source

## 2015-02-22 DIAGNOSIS — R109 Unspecified abdominal pain: Secondary | ICD-10-CM | POA: Insufficient documentation

## 2015-02-22 DIAGNOSIS — O9989 Other specified diseases and conditions complicating pregnancy, childbirth and the puerperium: Secondary | ICD-10-CM | POA: Diagnosis not present

## 2015-02-22 DIAGNOSIS — Z3A01 Less than 8 weeks gestation of pregnancy: Secondary | ICD-10-CM | POA: Insufficient documentation

## 2015-02-22 DIAGNOSIS — O208 Other hemorrhage in early pregnancy: Secondary | ICD-10-CM | POA: Insufficient documentation

## 2015-02-22 DIAGNOSIS — W458XXA Other foreign body or object entering through skin, initial encounter: Secondary | ICD-10-CM | POA: Diagnosis not present

## 2015-02-22 DIAGNOSIS — O26899 Other specified pregnancy related conditions, unspecified trimester: Secondary | ICD-10-CM

## 2015-02-22 DIAGNOSIS — M795 Residual foreign body in soft tissue: Secondary | ICD-10-CM

## 2015-02-22 HISTORY — DX: Unspecified asthma, uncomplicated: J45.909

## 2015-02-22 LAB — WET PREP, GENITAL
Trich, Wet Prep: NONE SEEN
Yeast Wet Prep HPF POC: NONE SEEN

## 2015-02-22 LAB — URINALYSIS, ROUTINE W REFLEX MICROSCOPIC
Bilirubin Urine: NEGATIVE
Glucose, UA: NEGATIVE mg/dL
Hgb urine dipstick: NEGATIVE
KETONES UR: NEGATIVE mg/dL
LEUKOCYTES UA: NEGATIVE
Nitrite: NEGATIVE
Protein, ur: NEGATIVE mg/dL
Specific Gravity, Urine: <1.005 — ABNORMAL LOW (ref 1.005–1.030)
Urobilinogen, UA: 0.2 mg/dL (ref 0.0–1.0)
pH: 6 (ref 5.0–8.0)
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
## 2015-02-23 ENCOUNTER — Inpatient Hospital Stay (HOSPITAL_COMMUNITY): Payer: No Typology Code available for payment source

## 2015-02-23 LAB — ABO/RH: ABO/RH(D): A POS

## 2015-02-23 LAB — HIV ANTIBODY (ROUTINE TESTING W REFLEX): HIV Screen 4th Generation wRfx: NONREACTIVE

## 2015-02-23 LAB — HCG, QUANTITATIVE, PREGNANCY: hCG, Beta Chain, Quant, S: 40591 m[IU]/mL — ABNORMAL HIGH (ref <5)

## 2015-02-23 MED ORDER — CEPHALEXIN 500 MG PO CAPS: 500.0000 mg | ORAL_CAPSULE | Freq: Four times a day (QID) | ORAL | Status: DC
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
## 2015-02-23 NOTE — Progress Notes (Signed)
Reassessed left thigh, no bleeding noted.

## 2015-02-25 ENCOUNTER — Telehealth: Payer: Self-pay | Admitting: Obstetrics and Gynecology

## 2015-02-25 ENCOUNTER — Inpatient Hospital Stay (HOSPITAL_COMMUNITY)
Admission: AD | Admit: 2015-02-25 | Discharge: 2015-02-25 | Disposition: A | Discharge status: Home / Self Care | Payer: No Typology Code available for payment source | Source: Ambulatory Visit | Attending: Obstetrics and Gynecology | Admitting: Obstetrics and Gynecology

## 2015-02-25 ENCOUNTER — Encounter (HOSPITAL_COMMUNITY): Payer: Self-pay | Admitting: *Deleted

## 2015-02-25 ENCOUNTER — Inpatient Hospital Stay (HOSPITAL_COMMUNITY): Payer: No Typology Code available for payment source

## 2015-02-25 DIAGNOSIS — O020 Blighted ovum and nonhydatidiform mole: Secondary | ICD-10-CM | POA: Insufficient documentation

## 2015-02-25 DIAGNOSIS — O9989 Other specified diseases and conditions complicating pregnancy, childbirth and the puerperium: Secondary | ICD-10-CM | POA: Diagnosis present

## 2015-02-25 LAB — GC/CHLAMYDIA PROBE AMP (~~LOC~~) NOT AT ARMC
CHLAMYDIA, DNA PROBE: NEGATIVE
Neisseria Gonorrhea: NEGATIVE

## 2015-02-25 LAB — HCG, QUANTITATIVE, PREGNANCY: HCG, BETA CHAIN, QUANT, S: 48772 m[IU]/mL — AB (ref <5)

## 2015-02-25 NOTE — Discharge Instructions (Signed)
Blighted Ovum A blighted ovum (anembryonic pregnancy) happens when a fertilized egg (embryo) attaches itself to the uterine wall, but the embryo does not develop. The pregnancy sac (placenta) continues to grow even though the embryo does not grow and develop.The pregnancy hormone is still secreted because the placenta has formed. This will result in a positive pregnancy test despite having an abnormal pregnancy. A blighted ovum occurs within the first trimester, sometimes before a woman knows she is pregnant.  CAUSES A blighted ovum is usually the result of chromosomal problems. This can be caused by abnormal cell division or poor quality sperm or egg. SYMPTOMS Early on, signs of pregnancy may be experienced, such as:  A missed menstrual period.  Fatigue.  Feeling sick to your stomach (nauseous).  Sore breasts.  A positive pregnancy test. Then, signs of miscarriage may develop, such as:  Abdominal cramps.  Vaginal bleeding or spotting.  A menstrual period that is heavier than usual. DIAGNOSIS The diagnosis of a blighted ovum is made with an ultrasound test that shows an empty uterus or an empty gestational sac. TREATMENT Your caregiver will help you decide what the best treatment is for you. Treatment for a blighted ovum includes:   Letting your body naturally pass the tissue of a blighted ovum.  Taking medicine to trigger the miscarriage.  Having a procedure called a dilation and curettage (D&C) to remove the placental tissues.  A D&C may be helpful if you would like the tissue examined to determine the reason for a miscarriage. Talk to your caregiver about the risks involved with this procedure. HOME CARE INSTRUCTIONS   Follow up with your caregiver to make sure that your pregnancy hormone returns to zero.  Wait at least 1 to 3 regular menstrual cycles before trying to get pregnant again, or as recommended by your caregiver. SEEK IMMEDIATE MEDICAL CARE IF:  You have  worsening abdominal pain.  You have very heavy bleeding or use 1 to 2 pads every hour, for more than 2 hours.  You are dizzy, feel faint, or pass out. Document Released: 12/16/2010 Document Revised: 11/23/2011 Document Reviewed: 12/16/2010 ExitCare Patient Information 2015 [employer]. This information is not intended to replace advice given to you by your health care provider. Make sure you discuss any questions you have with your health care provider.  

## 2015-02-25 NOTE — MAU Provider Note (Signed)
History     CSN: [id number]  Arrival date and time: 02/25/15 1905   None     Chief Complaint  Patient presents with  . Labs Only   HPI Comments: [name] Fry is a 34 y.o. [id number] at [redacted]w[redacted]d who presents today for FU HCG. She denies any abdominal pain or vaginal bleeding at this time. The cramping she had on 6/10 has resolved at this time. Current pain 0/10. She denies any vaginal discharge. She has an appointment with [facility] on 02/27/15.   Past Medical History  Diagnosis Date  . Migraines   . Vaginal Pap smear, abnormal   . Asthma     Past Surgical History  Procedure Laterality Date  . Cesarean section    . Cesarean section    . Wisdom tooth extraction      Family History  Problem Relation Age of Onset  . Asthma Father     History  Substance Use Topics  . Smoking status: Never Smoker   . Smokeless tobacco: Never Used  . Alcohol Use: No     Comment: socially    Allergies:  Allergies  Allergen Reactions  . Tylenol [Acetaminophen] Rash    And facial swelling  . Aleve [Naproxen Sodium] Swelling  . Other Hives, Swelling and Other (See Comments)    Pt states that she is allergic to all pain medications.  Able top take Ultram.   . Vicodin [Hydrocodone-Acetaminophen] Swelling    Facial swelling and rash    Prescriptions prior to admission  Medication Sig Dispense Refill Last Dose  . albuterol (PROVENTIL HFA;VENTOLIN HFA) 108 (90 BASE) MCG/ACT inhaler Inhale 2 puffs into the lungs every 4 (four) hours as needed for wheezing or shortness of breath. 1 Inhaler 3 Past Month at Unknown time  . cephALEXin (KEFLEX) 500 MG capsule Take 1 capsule (500 mg total) by mouth 4 (four) times daily. 20 capsule 0   . EPINEPHrine (EPIPEN 2-PAK) 0.3 mg/0.3 mL IJ SOAJ injection Inject 0.3 mLs (0.3 mg total) into the muscle once. (Patient taking differently: Inject 0.3 mg into the muscle once as needed (for allergic reaction). ) 1 Device 1 PRN at PRN  . Olopatadine HCl (PATADAY) 0.2 % SOLN  Place 1 drop into both eyes daily.   Past Month at Unknown time  . Prenat w/o A Vit-FeFum-FePo-FA (CONCEPT OB) 130-92.4-1 MG CAPS Take 1 tablet by mouth daily. 30 capsule 12 02/22/2015 at Unknown time    Review of Systems  Constitutional: Negative for fever.  Gastrointestinal: Negative for nausea, vomiting, abdominal pain, diarrhea and constipation.  Genitourinary: Negative for dysuria and urgency.   Physical Exam   Blood pressure 129/78, pulse 98, temperature 98.7 F (37.1 C), temperature source Oral, resp. rate 16, height 5' 8.5" (1.74 m), weight 82.101 kg (181 lb), last menstrual period 12/28/2014, SpO2 100 %.  Physical Exam  Nursing note and vitals reviewed. Constitutional: She is oriented to person, place, and time. She appears well-developed and well-nourished. No distress.  HENT:  Head: Normocephalic.  Cardiovascular: Normal rate.   Respiratory: Effort normal.  GI: Soft. There is no tenderness. There is no rebound.  Neurological: She is alert and oriented to person, place, and time.  Skin: Skin is warm and dry.  Psychiatric: She has a normal mood and affect.   Results for [name], Fry (MRN [id number]) as of 02/25/2015 21:20  Ref. Range 02/22/2015 23:05 02/22/2015 23:46 02/23/2015 00:42 02/25/2015 19:30  HCG, Beta Chain, Quant, S Latest Ref Range: <5 mIU/mL  40591 (H)   [id number] (H)   US Ob Transvaginal  02/25/2015   CLINICAL DATA:  Followup viability. Quantitative beta HCG is 48,772. Estimated gestational age by LMP is 8 weeks 3 days.  EXAM: TRANSVAGINAL OB ULTRASOUND  TECHNIQUE: Transvaginal ultrasound was performed for complete evaluation of the gestation as well as the maternal uterus, adnexal regions, and pelvic cul-de-sac.  COMPARISON:  02/22/2015  FINDINGS: Intrauterine gestational sac: Intrauterine gestational sac is demonstrated. Sac size slightly larger than on prior study.  Yolk sac:  Not identified.  Embryo:  Not identified.  Cardiac Activity: Not identified.  MSD: 14.4  mm    6 w   2  d  Maternal uterus/adnexae: No myometrial mass lesions. Small subchorionic hemorrhage is suggested. Both ovaries are visualized and appear normal. No abnormal adnexal masses. No free fluid.  IMPRESSION: Persistent finding of an intrauterine gestational sac without fetal pole or yolk sac. This may represent early intrauterine pregnancy or abnormal pregnancy/blighted ovum. Probable early intrauterine gestational sac, but no yolk sac, fetal pole, or cardiac activity yet visualized. Recommend follow-up quantitative B-HCG levels and follow-up US in 14 days to confirm and assess viability. This recommendation follows SRU consensus guidelines: Diagnostic Criteria for Nonviable Pregnancy Early in the First Trimester. Malva Limes Med 2013; 161:0960-45.   Electronically Signed   By: Burman Nieves M.D.   On: 02/25/2015 21:42    MAU Course  Procedures  MDM HCG did not increase significantly over 48 hours Will repeat US, pregnancy of unknown location   Assessment and Plan   1. Blighted ovum    DC home Comfort measures reviewed  1st Trimester precautions  Bleeding precautions SAB precautions  Ectopic precautions RX: none  Return to MAU as needed FU with OB as planned  Follow-up Information    Follow up with [facility].   Specialty:  Obstetrics and Gynecology   Why:  As scheduled for 02/27/15   Contact information:   [address] [place] [place] [address] [phone]        Tawnya Crook 02/25/2015, 9:19 PM

## 2015-02-25 NOTE — Telephone Encounter (Deleted)
-----   Message from Tilda Burrow, MD sent at 02/23/2015  8:54 PM EDT ----- Regarding: followup Victorino Dike, should I have handled this pt differently ?

## 2015-02-25 NOTE — MAU Note (Signed)
Pt. Urine in lab 

## 2015-02-25 NOTE — MAU Note (Signed)
States here for follow up blood work only. No complaints today. Has not seen [facility] in 4 years; scheduled for first OB appt on 02/27/15.

## 2015-03-05 ENCOUNTER — Other Ambulatory Visit: Payer: Self-pay | Admitting: Obstetrics and Gynecology

## 2015-03-05 ENCOUNTER — Encounter (HOSPITAL_COMMUNITY): Payer: Self-pay | Admitting: *Deleted

## 2015-03-06 ENCOUNTER — Encounter (HOSPITAL_COMMUNITY)
Admission: RE | Disposition: A | Discharge status: Home / Self Care | Payer: Self-pay | Source: Ambulatory Visit | Attending: Obstetrics and Gynecology

## 2015-03-06 ENCOUNTER — Ambulatory Visit (HOSPITAL_COMMUNITY)
Admission: RE | Admit: 2015-03-06 | Discharge: 2015-03-06 | Disposition: A | Discharge status: Home / Self Care | Payer: No Typology Code available for payment source | Source: Ambulatory Visit | Attending: Obstetrics and Gynecology | Admitting: Obstetrics and Gynecology

## 2015-03-06 ENCOUNTER — Ambulatory Visit (HOSPITAL_COMMUNITY): Payer: No Typology Code available for payment source | Admitting: Anesthesiology

## 2015-03-06 ENCOUNTER — Ambulatory Visit (HOSPITAL_COMMUNITY): Payer: No Typology Code available for payment source

## 2015-03-06 ENCOUNTER — Encounter (HOSPITAL_COMMUNITY): Payer: Self-pay | Admitting: Anesthesiology

## 2015-03-06 DIAGNOSIS — Z886 Allergy status to analgesic agent status: Secondary | ICD-10-CM | POA: Insufficient documentation

## 2015-03-06 DIAGNOSIS — Z888 Allergy status to other drugs, medicaments and biological substances status: Secondary | ICD-10-CM | POA: Diagnosis not present

## 2015-03-06 DIAGNOSIS — Z885 Allergy status to narcotic agent status: Secondary | ICD-10-CM | POA: Insufficient documentation

## 2015-03-06 DIAGNOSIS — O021 Missed abortion: Secondary | ICD-10-CM | POA: Insufficient documentation

## 2015-03-06 DIAGNOSIS — O029 Abnormal product of conception, unspecified: Secondary | ICD-10-CM

## 2015-03-06 DIAGNOSIS — G43909 Migraine, unspecified, not intractable, without status migrainosus: Secondary | ICD-10-CM | POA: Diagnosis not present

## 2015-03-06 DIAGNOSIS — J45909 Unspecified asthma, uncomplicated: Secondary | ICD-10-CM | POA: Diagnosis not present

## 2015-03-06 HISTORY — PX: OPERATIVE ULTRASOUND: SHX5996

## 2015-03-06 HISTORY — PX: DILATION AND CURETTAGE OF UTERUS: SHX78

## 2015-03-06 HISTORY — DX: Cardiac murmur, unspecified: R01.1

## 2015-03-06 LAB — CBC
HEMATOCRIT: 35.5 % — AB (ref 36.0–46.0)
Hemoglobin: 12.1 g/dL (ref 12.0–15.0)
MCH: 32.2 pg (ref 26.0–34.0)
MCHC: 34.1 g/dL (ref 30.0–36.0)
MCV: 94.4 fL (ref 78.0–100.0)
Platelets: 254 10*3/uL (ref 150–400)
RBC: 3.76 MIL/uL — ABNORMAL LOW (ref 3.87–5.11)
RDW: 12.7 % (ref 11.5–15.5)
WBC: 6.2 10*3/uL (ref 4.0–10.5)

## 2015-03-06 LAB — PREGNANCY, URINE: Preg Test, Ur: POSITIVE — AB

## 2015-03-06 SURGERY — DILATION AND CURETTAGE
Anesthesia: Monitor Anesthesia Care | Site: Vagina

## 2015-03-06 MED ORDER — DIPHENHYDRAMINE HCL 50 MG/ML IJ SOLN
INTRAMUSCULAR | Status: DC | PRN
  Administered 2015-03-06: 25 mg via INTRAVENOUS

## 2015-03-06 MED ORDER — FENTANYL CITRATE (PF) 100 MCG/2ML IJ SOLN
INTRAMUSCULAR | Status: DC | PRN
  Administered 2015-03-06 (×4): 25 ug via INTRAVENOUS

## 2015-03-06 MED ORDER — LIDOCAINE HCL (CARDIAC) 20 MG/ML IV SOLN
INTRAVENOUS | Status: AC
  Filled 2015-03-06: qty 5

## 2015-03-06 MED ORDER — TRAMADOL HCL 50 MG PO TABS: 50.0000 mg | ORAL_TABLET | Freq: Four times a day (QID) | ORAL | Status: DC | PRN

## 2015-03-06 MED ORDER — PROPOFOL 10 MG/ML IV BOLUS
INTRAVENOUS | Status: AC
  Filled 2015-03-06: qty 20

## 2015-03-06 MED ORDER — PROPOFOL 10 MG/ML IV BOLUS
INTRAVENOUS | Status: DC | PRN
  Administered 2015-03-06: 10 mg via INTRAVENOUS
  Administered 2015-03-06 (×5): 20 mg via INTRAVENOUS

## 2015-03-06 MED ORDER — DEXAMETHASONE SODIUM PHOSPHATE 4 MG/ML IJ SOLN
INTRAMUSCULAR | Status: AC
  Filled 2015-03-06: qty 1

## 2015-03-06 MED ORDER — 0.9 % SODIUM CHLORIDE (POUR BTL) OPTIME
TOPICAL | Status: DC | PRN
  Administered 2015-03-06: 1000 mL

## 2015-03-06 MED ORDER — PROPOFOL INFUSION 10 MG/ML OPTIME
INTRAVENOUS | Status: DC | PRN
  Administered 2015-03-06: 50 ug/kg/min via INTRAVENOUS

## 2015-03-06 MED ORDER — SCOPOLAMINE 1 MG/3DAYS TD PT72
MEDICATED_PATCH | TRANSDERMAL | Status: AC
  Filled 2015-03-06: qty 1

## 2015-03-06 MED ORDER — DOXYCYCLINE HYCLATE 50 MG PO CAPS: 100.0000 mg | ORAL_CAPSULE | Freq: Two times a day (BID) | ORAL | Status: DC

## 2015-03-06 MED ORDER — SILVER NITRATE-POT NITRATE 75-25 % EX MISC
CUTANEOUS | Status: DC | PRN
  Administered 2015-03-06: 4

## 2015-03-06 MED ORDER — METOCLOPRAMIDE HCL 5 MG/ML IJ SOLN: 10.0000 mg | Freq: Once | INTRAMUSCULAR | Status: DC | PRN

## 2015-03-06 MED ORDER — LIDOCAINE HCL 2 % IJ SOLN
INTRAMUSCULAR | Status: DC | PRN
  Administered 2015-03-06: 20 mL

## 2015-03-06 MED ORDER — FENTANYL CITRATE (PF) 100 MCG/2ML IJ SOLN: 25.0000 ug | INTRAMUSCULAR | Status: DC | PRN

## 2015-03-06 MED ORDER — MEPERIDINE HCL 25 MG/ML IJ SOLN: 6.2500 mg | INTRAMUSCULAR | Status: DC | PRN

## 2015-03-06 MED ORDER — MIDAZOLAM HCL 2 MG/2ML IJ SOLN
INTRAMUSCULAR | Status: AC
  Filled 2015-03-06: qty 2

## 2015-03-06 MED ORDER — METOCLOPRAMIDE HCL 5 MG/ML IJ SOLN
INTRAMUSCULAR | Status: AC
  Filled 2015-03-06: qty 2

## 2015-03-06 MED ORDER — ONDANSETRON HCL 4 MG/2ML IJ SOLN
INTRAMUSCULAR | Status: AC
  Filled 2015-03-06: qty 2

## 2015-03-06 MED ORDER — FENTANYL CITRATE (PF) 100 MCG/2ML IJ SOLN
INTRAMUSCULAR | Status: AC
  Filled 2015-03-06: qty 2

## 2015-03-06 MED ORDER — PROPOFOL 500 MG/50ML IV EMUL
INTRAVENOUS | Status: AC
Start: 2015-03-06 — End: 2015-03-06
  Filled 2015-03-06: qty 50

## 2015-03-06 MED ORDER — MIDAZOLAM HCL 2 MG/2ML IJ SOLN
INTRAMUSCULAR | Status: DC | PRN
  Administered 2015-03-06: 2 mg via INTRAVENOUS

## 2015-03-06 MED ORDER — DEXAMETHASONE SODIUM PHOSPHATE 10 MG/ML IJ SOLN
INTRAMUSCULAR | Status: DC | PRN
  Administered 2015-03-06: 4 mg via INTRAVENOUS

## 2015-03-06 MED ORDER — LIDOCAINE HCL 2 % IJ SOLN
INTRAMUSCULAR | Status: AC
  Filled 2015-03-06: qty 20

## 2015-03-06 MED ORDER — METOCLOPRAMIDE HCL 5 MG/ML IJ SOLN
INTRAMUSCULAR | Status: DC | PRN
  Administered 2015-03-06: 10 mg via INTRAVENOUS

## 2015-03-06 MED ORDER — LIDOCAINE HCL (CARDIAC) 20 MG/ML IV SOLN
INTRAVENOUS | Status: DC | PRN
Start: 2015-03-06 — End: 2015-03-06
  Administered 2015-03-06: 40 mg via INTRAVENOUS

## 2015-03-06 MED ORDER — ONDANSETRON HCL 4 MG/2ML IJ SOLN
INTRAMUSCULAR | Status: DC | PRN
  Administered 2015-03-06: 4 mg via INTRAVENOUS

## 2015-03-06 MED ORDER — SCOPOLAMINE 1 MG/3DAYS TD PT72
1.0000 | MEDICATED_PATCH | Freq: Once | TRANSDERMAL | Status: DC
  Administered 2015-03-06: 1.5 mg via TRANSDERMAL

## 2015-03-06 MED ORDER — LACTATED RINGERS IV SOLN
INTRAVENOUS | Status: DC
  Administered 2015-03-06 (×2): via INTRAVENOUS

## 2015-03-06 SURGICAL SUPPLY — 16 items
CATH ROBINSON RED A/P 16FR (CATHETERS) ×4 IMPLANT
CLOTH BEACON ORANGE TIMEOUT ST (SAFETY) ×4 IMPLANT
CONTAINER PREFILL 10% NBF 60ML (FORM) ×8 IMPLANT
DILATOR CANAL MILEX (MISCELLANEOUS) IMPLANT
GLOVE BIO SURGEON STRL SZ 6.5 (GLOVE) ×3 IMPLANT
GLOVE BIO SURGEONS STRL SZ 6.5 (GLOVE) ×1
GLOVE BIOGEL PI IND STRL 7.0 (GLOVE) ×2 IMPLANT
GLOVE BIOGEL PI INDICATOR 7.0 (GLOVE) ×2
GOWN STRL REUS W/TWL LRG LVL3 (GOWN DISPOSABLE) ×8 IMPLANT
PACK VAGINAL MINOR WOMEN LF (CUSTOM PROCEDURE TRAY) ×4 IMPLANT
PAD OB MATERNITY 4.3X12.25 (PERSONAL CARE ITEMS) ×4 IMPLANT
PAD PREP 24X48 CUFFED NSTRL (MISCELLANEOUS) ×4 IMPLANT
SYR 20CC LL (SYRINGE) ×4 IMPLANT
TOWEL OR 17X24 6PK STRL BLUE (TOWEL DISPOSABLE) ×8 IMPLANT
VACURETTE 7MM CVD STRL WRAP (CANNULA) ×2 IMPLANT
WATER STERILE IRR 1000ML POUR (IV SOLUTION) ×4 IMPLANT

## 2015-03-06 NOTE — H&P (Signed)
  [name] Fry is a 34 y.o. female presenting for a D&E for a missed abortion.   History OB History    Gravida Para Term Preterm AB TAB SAB Ectopic Multiple Living   6 3 2 1 2  2   3      Past Medical History  Diagnosis Date  . Migraines   . Vaginal Pap smear, abnormal   . Asthma   . Heart murmur     as a child   Past Surgical History  Procedure Laterality Date  . Cesarean section    . Cesarean section    . Wisdom tooth extraction    . Vaginal birth after cesarean section  01/10/06   Family History: family history includes Asthma in her father. Social History:  reports that she has never smoked. She has never used smokeless tobacco. She reports that she drinks alcohol. She reports that she does not use illicit drugs.  Review of Systems - Negative except up above    Blood pressure 132/75, pulse 81, temperature 98.6 F (37 C), resp. rate 18, height 5\' 8"  (1.727 m), weight 180 lb (81.647 kg), last menstrual period 12/28/2014, SpO2 100 %. Exam Physical Exam  Physical Examination: General appearance - alert, well appearing, and in no distress Chest - clear to auscultation, no wheezes, rales or rhonchi, symmetric air entry Heart - normal rate, regular rhythm, normal S1, S2, no murmurs, rubs, clicks or gallops Abdomen - soft, nontender, nondistended, no masses or organomegaly Pelvic - normal external genitalia, vulva, vagina, cervix, uterus and adnexa Extremities - peripheral pulses normal, no pedal edema, no clubbing or cyanosis  Prenatal labs: ABO, Rh: --/--/A POS (06/10 2305) Antibody:   Rubella:   RPR:    HBsAg:    HIV:    GBS:     Assessment/Plan: Pt given option of obs, cytotc or D&E R&B of all reivewed She chose D&E.  She understands the risks to be bleeding, perforation, ashermnas syndrome and infection.     Lazer Wollard A 03/06/2015, 3:24 PM

## 2015-03-06 NOTE — Transfer of Care (Signed)
Immediate Anesthesia Transfer of Care Note  Patient: [name] Fry  Procedure(s) Performed: Procedure(s): Suction DILATATION AND CURETTAGE (N/A) OPERATIVE ULTRASOUND (N/A)  Patient Location: PACU  Anesthesia Type:MAC  Level of Consciousness: awake, alert , oriented and patient cooperative  Airway & Oxygen Therapy: Patient Spontanous Breathing and Patient connected to nasal cannula oxygen  Post-op Assessment: Report given to RN and Post -op Vital signs reviewed and stable  Post vital signs: Reviewed and stable  Last Vitals:  Filed Vitals:   03/06/15 1447  BP: 132/75  Pulse: 81  Temp: 37 C  Resp: 18    Complications: No apparent anesthesia complications

## 2015-03-06 NOTE — Anesthesia Preprocedure Evaluation (Signed)
Anesthesia Evaluation  Patient identified by MRN, date of birth, ID band Patient awake    Reviewed: Allergy & Precautions, NPO status , Patient's Chart, lab work & pertinent test results  Airway Mallampati: II  TM Distance: >3 FB Neck ROM: Full    Dental no notable dental hx. (+) Teeth Intact   Pulmonary asthma ,  breath sounds clear to auscultation  Pulmonary exam normal       Cardiovascular negative cardio ROS Normal cardiovascular exam+ Valvular Problems/Murmurs Rhythm:Regular Rate:Normal     Neuro/Psych  Headaches, negative psych ROS   GI/Hepatic negative GI ROS, Neg liver ROS,   Endo/Other  negative endocrine ROS  Renal/GU negative Renal ROS  negative genitourinary   Musculoskeletal negative musculoskeletal ROS (+)   Abdominal   Peds  Hematology negative hematology ROS (+)   Anesthesia Other Findings   Reproductive/Obstetrics Blighted ovum                             Anesthesia Physical Anesthesia Plan  ASA: II  Anesthesia Plan: MAC   Post-op Pain Management:    Induction:   Airway Management Planned: Natural Airway and Mask  Additional Equipment:   Intra-op Plan:   Post-operative Plan:   Informed Consent: I have reviewed the patients History and Physical, chart, labs and discussed the procedure including the risks, benefits and alternatives for the proposed anesthesia with the patient or authorized representative who has indicated his/her understanding and acceptance.   Dental advisory given  Plan Discussed with: CRNA, Anesthesiologist and Surgeon  Anesthesia Plan Comments:         Anesthesia Quick Evaluation

## 2015-03-06 NOTE — Anesthesia Postprocedure Evaluation (Signed)
  Anesthesia Post-op Note  Patient: [name] Fry  Procedure(s) Performed: Procedure(s): Suction DILATATION AND CURETTAGE (N/A) OPERATIVE ULTRASOUND (N/A)  Patient Location: PACU  Anesthesia Type:MAC  Level of Consciousness: awake, alert  and oriented  Airway and Oxygen Therapy: Patient Spontanous Breathing  Post-op Pain: mild  Post-op Assessment: Post-op Vital signs reviewed, Patient's Cardiovascular Status Stable, Respiratory Function Stable, Patent Airway, No signs of Nausea or vomiting and Pain level controlled              Post-op Vital Signs: Reviewed and stable  Last Vitals:  Filed Vitals:   03/06/15 1730  BP: 113/63  Pulse: 90  Temp:   Resp: 24    Complications: No apparent anesthesia complications

## 2015-03-06 NOTE — Discharge Instructions (Signed)
DISCHARGE INSTRUCTIONS: D&C / D&E The following instructions have been prepared to help you care for yourself upon your return home.  May remove Scop patch on or before 03/08/2015   Personal hygiene:  Use sanitary pads for vaginal drainage, not tampons.  Shower the day after your procedure.  NO tub baths, pools or Jacuzzis for 2-3 weeks.  Wipe front to back after using the bathroom.  Activity and limitations:  Do NOT drive or operate any equipment for 24 hours. The effects of anesthesia are still present and drowsiness may result.  Do NOT rest in bed all day.  Walking is encouraged.  Walk up and down stairs slowly.  You may resume your normal activity in one to two days or as indicated by your physician.  Sexual activity: NO intercourse for at least 2 weeks after the procedure, or as indicated by your physician.  Diet: Eat a light meal as desired this evening. You may resume your usual diet tomorrow.  Return to work: You may resume your work activities in one to two days or as indicated by your doctor.  What to expect after your surgery: Expect to have vaginal bleeding/discharge for 2-3 days and spotting for up to 10 days. It is not unusual to have soreness for up to 1-2 weeks. You may have a slight burning sensation when you urinate for the first day. Mild cramps may continue for a couple of days. You may have a regular period in 2-6 weeks.  Call your doctor for any of the following:  Excessive vaginal bleeding, saturating and changing one pad every hour.  Inability to urinate 6 hours after discharge from hospital.  Pain not relieved by pain medication.  Fever of 100.4 F or greater.  Unusual vaginal discharge or odor.   Call for an appointment:    Patients signature: ______________________  Nurses signature ________________________  Support person's signature_______________________   Dian Queen A blighted ovum (anembryonic pregnancy) happens when a  fertilized egg (embryo) attaches itself to the uterine wall, but the embryo does not develop. The pregnancy sac (placenta) continues to grow even though the embryo does not grow and develop.The pregnancy hormone is still secreted because the placenta has formed. This will result in a positive pregnancy test despite having an abnormal pregnancy. A blighted ovum occurs within the first trimester, sometimes before a woman knows she is pregnant.  CAUSES A blighted ovum is usually the result of chromosomal problems. This can be caused by abnormal cell division or poor quality sperm or egg. SYMPTOMS Early on, signs of pregnancy may be experienced, such as:  A missed menstrual period.  Fatigue.  Feeling sick to your stomach (nauseous).  Sore breasts.  A positive pregnancy test. Then, signs of miscarriage may develop, such as:  Abdominal cramps.  Vaginal bleeding or spotting.  A menstrual period that is heavier than usual. DIAGNOSIS The diagnosis of a blighted ovum is made with an ultrasound test that shows an empty uterus or an empty gestational sac. TREATMENT Your caregiver will help you decide what the best treatment is for you. Treatment for a blighted ovum includes:   Letting your body naturally pass the tissue of a blighted ovum.  Taking medicine to trigger the miscarriage.  Having a procedure called a dilation and curettage (D&C) to remove the placental tissues.  A D&C may be helpful if you would like the tissue examined to determine the reason for a miscarriage. Talk to your caregiver about the risks involved with this  procedure. HOME CARE INSTRUCTIONS   Follow up with your caregiver to make sure that your pregnancy hormone returns to zero.  Wait at least 1 to 3 regular menstrual cycles before trying to get pregnant again, or as recommended by your caregiver. SEEK IMMEDIATE MEDICAL CARE IF:  You have worsening abdominal pain.  You have very heavy bleeding or use 1 to 2  pads every hour, for more than 2 hours.  You are dizzy, feel faint, or pass out. Document Released: 12/16/2010 Document Revised: 11/23/2011 Document Reviewed: 12/16/2010 [facility] Patient Information 2015 [place], [place]. This information is not intended to replace advice given to you by your health care provider. Make sure you discuss any questions you have with your health care provider.

## 2015-03-06 NOTE — Op Note (Signed)
Pre op DX :  missed abortion Postoperative Diagnosis: same Procedure:  dilation and evacuation Anesthesia:  MAC and local Surgeon:  Dr. Normand Sloop Asst : none Complications : none Procedure in detail: The patient was taken to the operating room where she was given MAC anesthesia. She was placed in dorsal lithotomy position and prepped and draped in normal sterile fashion. In and out catheter was used to drain the bladder. This was examined and noted to have a 10 week size uterus with no adnexal masses. A bivalve was placed into the vagina. A tenaculum was placed on the cervix. The cervix was infiltrated with 20 cc 1% lidocaine paracervical block. The cervix then dilated with dilators up to 21.  A size 7 suction curettage was placed into the uterine cavity. A scant amount of products of conception was seen. The suction curettage was removed when a gritty texture was noted. A sharp curette was done along all walls  of the uterus. The suction curet was placed back into the uterine cavity. No further products of conception were obtained. Sponge lap and needle counts were correct. Patient back in stable condition. The patient understood to be the risks to be, but not  limited to,  Bleeding,  Infection,  damage to internal organs by perforation of the uterus and Asherman syndrome (scarring in the uterus) leading to infertility.  proceudre done with US guidance.  It appears she may have a posterior fibroid

## 2015-03-09 ENCOUNTER — Encounter (HOSPITAL_COMMUNITY): Payer: Self-pay | Admitting: Obstetrics and Gynecology

## 2015-03-28 ENCOUNTER — Ambulatory Visit (INDEPENDENT_AMBULATORY_CARE_PROVIDER_SITE_OTHER): Payer: No Typology Code available for payment source | Admitting: Neurology

## 2015-03-28 ENCOUNTER — Encounter: Payer: Self-pay | Admitting: Neurology

## 2015-03-28 VITALS — BP 117/71 | HR 68 | Ht 68.0 in | Wt 189.8 lb

## 2015-03-28 DIAGNOSIS — G43709 Chronic migraine without aura, not intractable, without status migrainosus: Secondary | ICD-10-CM

## 2015-03-28 DIAGNOSIS — G43909 Migraine, unspecified, not intractable, without status migrainosus: Secondary | ICD-10-CM | POA: Insufficient documentation

## 2015-03-28 MED ORDER — AMITRIPTYLINE HCL 10 MG PO TABS: 10.0000 mg | ORAL_TABLET | Freq: Every day | ORAL | Status: DC

## 2015-03-28 NOTE — Progress Notes (Signed)
NEUROLOGY CLINIC NEW PATIENT NOTE  NAME: [name] TOBEY DOB: August 19, 1981 REFERRING PHYSICIAN: Jaymes Graff, MD  I saw Tama Gander as a new consult in the neurovascular clinic today regarding  Chief Complaint  Patient presents with  . Migraine    constant HA; tramadol alleviates sometimes; HA(s) increased after miscarriage and DNC on 03/06/15  .  HPI: [name] Fry is a 34 y.o. female with PMH of migraine headache, miscarriage who presents as a new patient for Headache.   Patient stated that she started to have migraine headache as a teenager, it was not too bad until reaching age of 89. Initially it happens 1-2 per months, and gradually increasing to twice a per week. The headache is all over the head feels heaviness lasting 1-2 hours. Headache is bitemporal, behind the eyes, sharp and throbbing, nagging pain, with photophobia, phonophobia, nausea vomiting, recurring dark room. Headache sometimes started in the morning, lasting throughout the day. She has tried multiple abortive medication for headache including, Excedrin, aspirin, Tylenol, Percocet, hydrocodone, tramadol. However, since 08/2014, all above mentioned medications causing her allergic reaction including facial swelling, hives, except tramadol.  She had miscarriage and D&C on 03/06/2015, since then she had a daily headache, 10/10, with tramadol twice a day the headache can go down to 7-8/10, but will come back after 2 hours. She has never tried preventive medications in the past.  She denies other significant past medical history. She denies smoking, or illicit drugs. Occasional drinking. Blood pressure today in clinic 117/71.  Past Medical History  Diagnosis Date  . Migraines   . Vaginal Pap smear, abnormal   . Asthma   . Heart murmur     as a child   Past Surgical History  Procedure Laterality Date  . Cesarean section    . Cesarean section    . Wisdom tooth extraction    . Vaginal birth after cesarean section   01/10/06  . Dilation and curettage of uterus N/A 03/06/2015    Procedure: Suction DILATATION AND CURETTAGE;  Surgeon: Jaymes Graff, MD;  Location: [facility];  Service: Gynecology;  Laterality: N/A;  . Operative ultrasound N/A 03/06/2015    Procedure: OPERATIVE ULTRASOUND;  Surgeon: Jaymes Graff, MD;  Location: [facility];  Service: Gynecology;  Laterality: N/A;   Family History  Problem Relation Age of Onset  . Asthma Father    Current Outpatient Prescriptions  Medication Sig Dispense Refill  . albuterol (PROVENTIL HFA;VENTOLIN HFA) 108 (90 BASE) MCG/ACT inhaler Inhale 2 puffs into the lungs every 4 (four) hours as needed for wheezing or shortness of breath. 1 Inhaler 3  . calcium carbonate (TUMS EX) 750 MG chewable tablet Chew 2 tablets by mouth daily as needed for heartburn.    Marland Kitchen EPINEPHrine (EPIPEN 2-PAK) 0.3 mg/0.3 mL IJ SOAJ injection Inject 0.3 mLs (0.3 mg total) into the muscle once. (Patient taking differently: Inject 0.3 mg into the muscle once as needed (for allergic reaction). ) 1 Device 1  . MONTELUKAST SODIUM PO Take 1 tablet by mouth daily as needed (allergies).    . traMADol (ULTRAM) 50 MG tablet Take 100 mg by mouth every 6 (six) hours as needed for moderate pain or severe pain (migraines).    Marland Kitchen amitriptyline (ELAVIL) 10 MG tablet Take 1 tablet (10 mg total) by mouth at bedtime. for one week, and then 2 tab at night for one week, and then 3 tab at night 90 tablet 3  . cephALEXin (KEFLEX) 500 MG capsule  Take 1 capsule (500 mg total) by mouth 4 (four) times daily. (Patient not taking: Reported on 03/28/2015) 20 capsule 0  . cetirizine (ZYRTEC) 10 MG tablet Take 10 mg by mouth daily as needed for allergies.    . fluticasone (VERAMYST) 27.5 MCG/SPRAY nasal spray Place 1 spray into the nose daily as needed for allergies.    . Olopatadine HCl (PATADAY) 0.2 % SOLN Place 1 drop into both eyes daily as needed (dry eyes and allergies).     Burnis Medin w/o A Vit-FeFum-FePo-FA (CONCEPT OB) 130-92.4-1  MG CAPS Take 1 tablet by mouth daily. (Patient not taking: Reported on 03/28/2015) 30 capsule 12   No current facility-administered medications for this visit.   Allergies  Allergen Reactions  . Tylenol [Acetaminophen] Hives and Rash    And facial swelling  . Aleve [Naproxen Sodium] Hives and Swelling  . Other Hives, Swelling and Other (See Comments)    Pt states that she is allergic to all pain medications.  Able top take Ultram.   . Vicodin [Hydrocodone-Acetaminophen] Hives and Swelling    Facial swelling and rash   History   Social History  . Marital Status: Married    Spouse Name: Hinton Dyer  . Number of Children: 3  . 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 Smoking status: Never Smoker   . Smokeless tobacco: Never Used  . Alcohol Use: 0.0 oz/week    0 Standard drinks or equivalent per week     Comment: socially; occasionally 2 beers or  wine  . Drug Use: No  . Sexual Activity: Yes    Birth Control/ Protection: None   Other Topics Concern  . Not on file   Social History Narrative   Patient lives at home with her family.   Caffeine: 8oz two times a week    Review of Systems Full 14 system review of systems performed and notable only for those listed, all others are neg:  Constitutional:   Cardiovascular:  Ear/Nose/Throat:   Skin:  Eyes:   Respiratory:   Gastroitestinal:   Genitourinary:  Hematology/Lymphatic:   Endocrine: Increased thirst Musculoskeletal:   Allergy/Immunology:  Allergies Neurological:  Headache, dizziness Psychiatric: Change in appetite Sleep:   Physical Exam  Filed Vitals:   03/28/15 1312  BP: 117/71  Pulse: 68    General - Well nourished, well developed, in no apparent distress.  Ophthalmologic - Sharp disc margins OU.  Cardiovascular - Regular rate and rhythm with no murmur. Carotid pulses were 2+ without bruits .   Neck - supple, no nuchal rigidity  .  Mental Status -  Level of arousal and orientation to time, place, and person were intact. Language including expression, naming, repetition, comprehension, reading, and writing was assessed and found intact. Attention span and concentration were normal. Recent and remote memory were intact. Fund of Knowledge was assessed and was intact.  Cranial Nerves II - XII - II - Visual field intact OU. III, IV, VI - Extraocular movements intact. V - Facial sensation intact bilaterally. VII - Facial movement intact bilaterally. VIII - Hearing & vestibular intact bilaterally. X - Palate elevates symmetrically. XI - Chin turning & shoulder shrug intact bilaterally. XII - Tongue protrusion intact.  Motor Strength - The patient's strength was normal in all extremities and pronator drift was absent.  Bulk was normal and fasciculations were absent.   Motor Tone - Muscle tone  was assessed at the neck and appendages and was normal.  Reflexes - The patient's reflexes were normal in all extremities and she had no pathological reflexes.  Sensory - Light touch, temperature/pinprick, vibration and proprioception, and Romberg testing were assessed and were normal.    Coordination - The patient had normal movements in the hands and feet with no ataxia or dysmetria.  Tremor was absent.  Gait and Station - The patient's transfers, posture, gait, station, and turns were observed as normal.   Imaging  I have personally reviewed the radiological images below and agree with the radiology interpretations.  None  Lab Review none  Assessments: NIHSS: 0 Rankin: 0   Assessment and Plan:   In summary, Kizzie A Lindie SpruceWyatt is a 34 y.o. female with PMH of migraine and miscarriage presents chronic daily headache after D&C in 02/2014. The worsening headache pattern could be associate with drastic female hormone changes after D&C. As per patient, this is her typical migraine pattern, however she has allergic reaction  to most of the pain medications except tramadol. However, she has never used preventive medications. Would like to start her with amitriptyline with titrating dosage. Continue tramadol for abortive medication. Patient was advised not to overuse tramadol or Fioricet to avoid rebound headache.  - continue tramadol as abortive medication for HA, use it at the earliest phase of headache. Avoid over use due to rebound headache. - Start amitriptyline for HA prevention, 10mg  QHS x 7, and then 20mg  Qhs x 7 and then 30mg  Qhs. May consider further titrating up if not effective - good sleep hygiene   - decrease stress level and recommend relaxation technique - RTC in 2 months.  Thank you very much for the opportunity to participate in the care of this patient.  Please do not hesitate to call if any questions or concerns arise.  No orders of the defined types were placed in this encounter.    Meds ordered this encounter  Medications  . amitriptyline (ELAVIL) 10 MG tablet    Sig: Take 1 tablet (10 mg total) by mouth at bedtime. for one week, and then 2 tab at night for one week, and then 3 tab at night    Dispense:  90 tablet    Refill:  3    Patient Instructions  - continue tramadol for abortive medication for HA, use it at the earliest phase of headache. Do not over use tramadol as it can cause rebound headache - prescribe to you amitriptyline for HA prevention, take 1 tab at night daily for one week and then 2 tab daily for a week and then 3 tab daily at night. - good sleep hygiene  - decrease stress level and relaxation technique - follow up in 2 months.   Marvel PlanJindong Larraine Argo, MD PhD [facility] [address], [address] [place], [place] [address] [phone]

## 2015-03-28 NOTE — Patient Instructions (Signed)
-   continue tramadol for abortive medication for HA, use it at the earliest phase of headache. Do not over use tramadol as it can cause rebound headache - prescribe to you amitriptyline for HA prevention, take 1 tab at night daily for one week and then 2 tab daily for a week and then 3 tab daily at night. - good sleep hygiene  - decrease stress level and relaxation technique - follow up in 2 months.

## 2015-06-05 ENCOUNTER — Ambulatory Visit: Payer: No Typology Code available for payment source | Admitting: Neurology

## 2015-09-05 ENCOUNTER — Telehealth: Payer: Self-pay | Admitting: Neurology

## 2015-09-05 NOTE — Telephone Encounter (Signed)
Britta MccreedyBarbara called and requested office visit notes on the patient from her apt in July be faxed to their office. Fax [phone] Debe CoderATTN Barbara.

## 2015-09-05 NOTE — Telephone Encounter (Signed)
Rn call Britta MccreedyBarbara in referrals at [phone] at Dr.Naima DIllard office. The office referred the patient in July 2016 by the office.  Fax number is [phone]. Rn stated the patient had another follow up in September and was a no call,no show.OFFice notes will be fax.

## 2015-09-05 NOTE — Telephone Encounter (Signed)
Call Britta MccreedyBarbara in referrals at the office that referred patient in July 2016. Last office notes fax and receive by Britta MccreedyBarbara.

## 2015-12-09 ENCOUNTER — Encounter (HOSPITAL_COMMUNITY): Payer: Self-pay | Admitting: *Deleted
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
## 2016-01-03 IMAGING — US US OB TRANSVAGINAL
1 series · 13 of 28 positions shown · non-contrast
Comparison: 02/22/2015

CLINICAL DATA: Followup viability. Quantitative beta HCG is [DATE].
Estimated gestational age by LMP is 8 weeks 3 days.

EXAM:
TRANSVAGINAL OB ULTRASOUND
TECHNIQUE: Transvaginal ultrasound was performed for complete evaluation of the
gestation as well as the maternal uterus, adnexal regions, and
pelvic cul-de-sac.

[Series 1: us ob transvaginal · 13 of 36 slices shown]
[im 2/36]
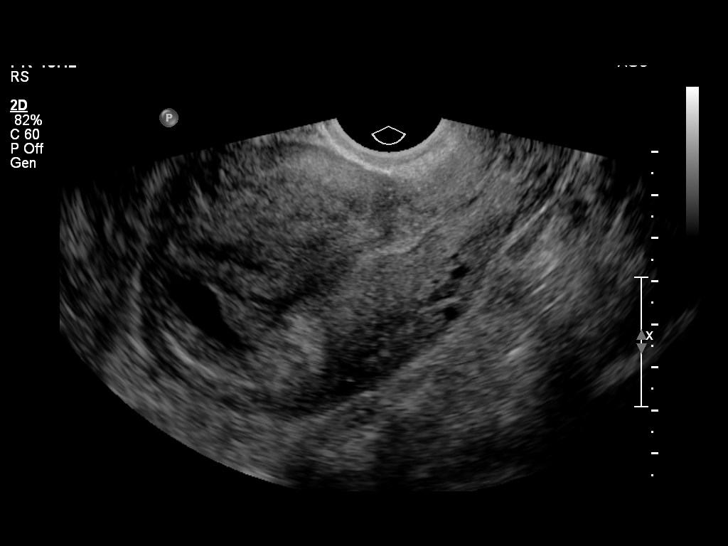
[im 4/36]
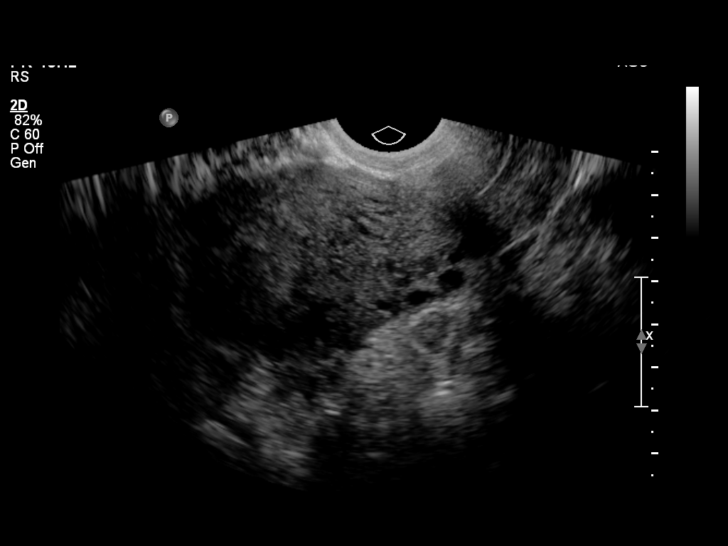
[im 7/36]
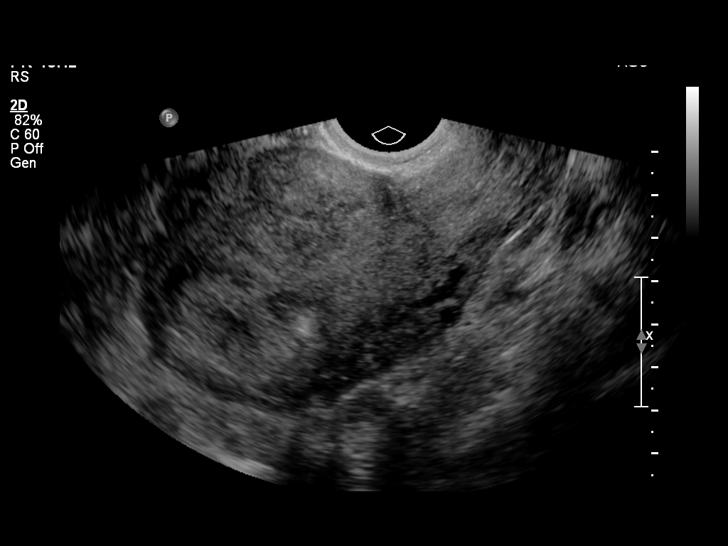
[im 10/36]
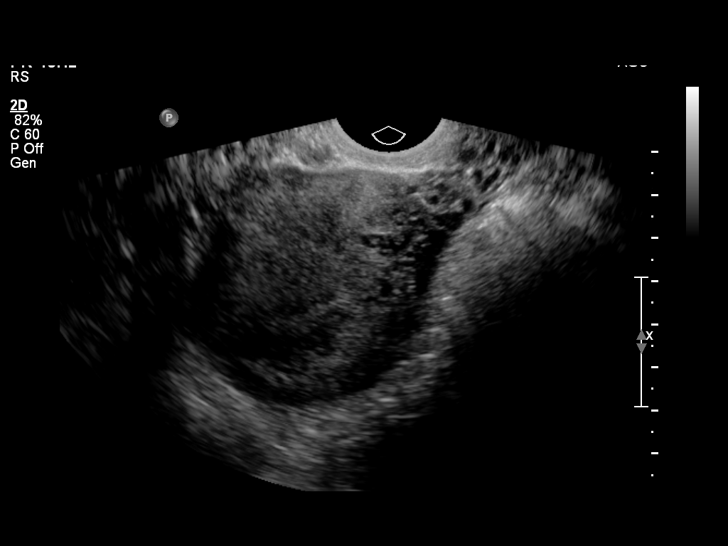
[im 12/36]
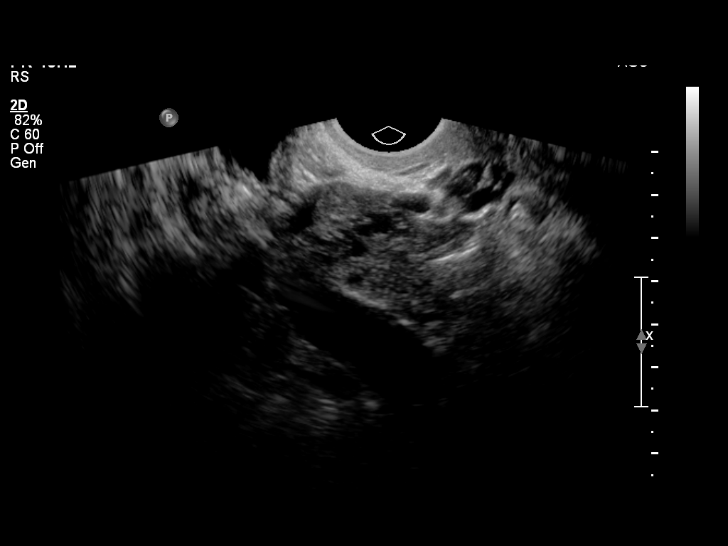
[im 15/36]
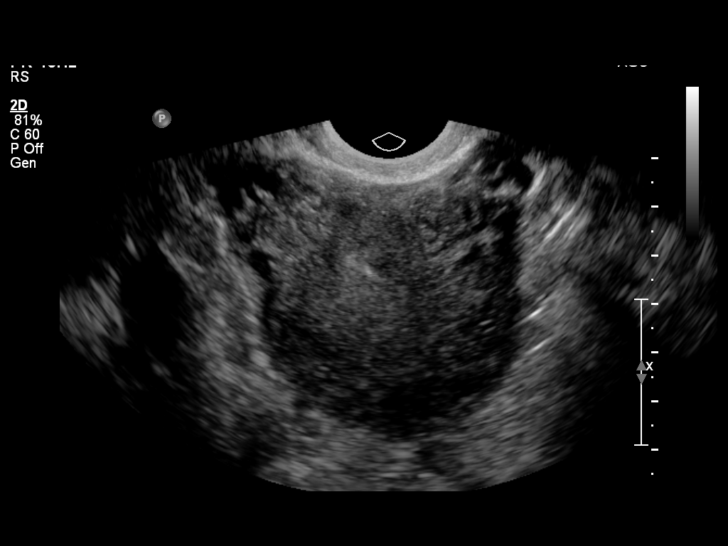
[im 19/36]
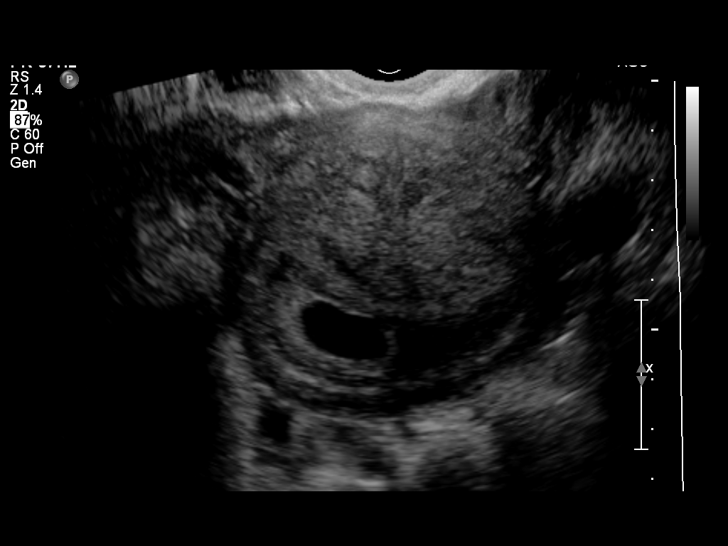
[im 21/36]
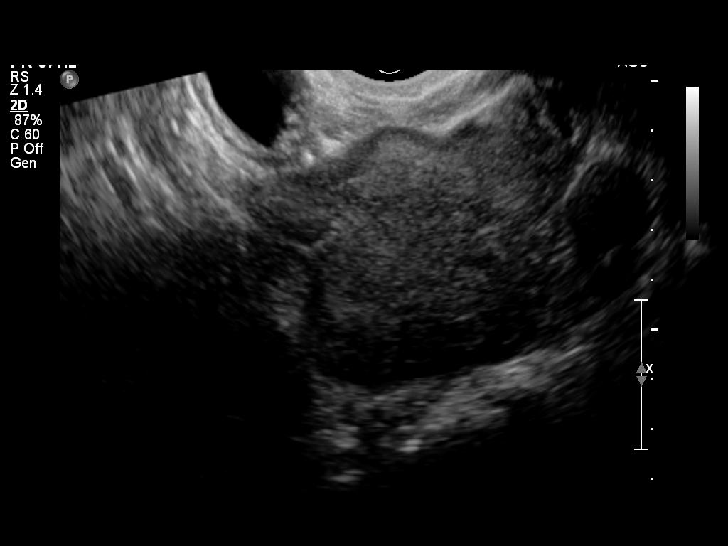
[im 24/36]
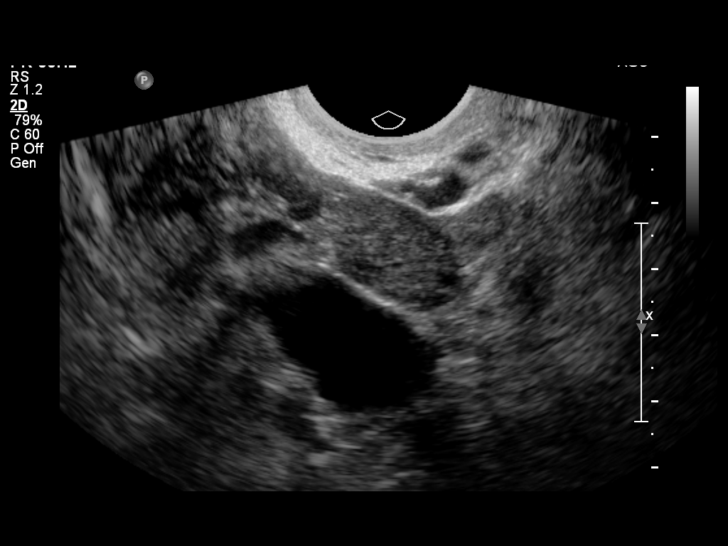
[im 26/36]
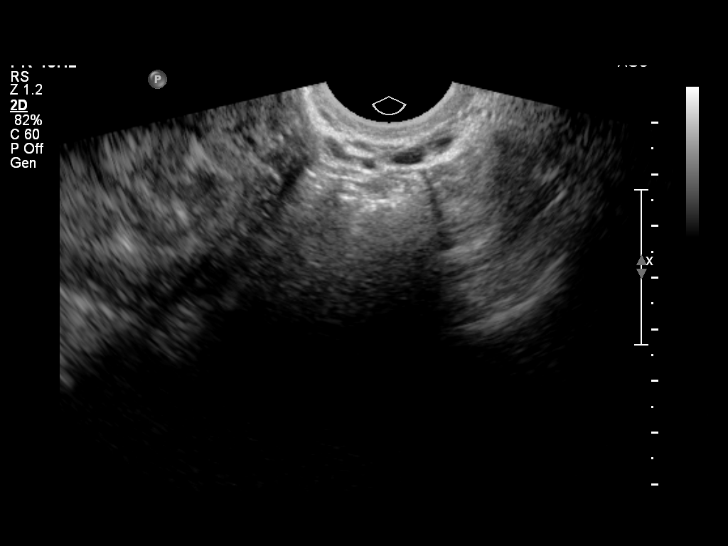
[im 29/36]
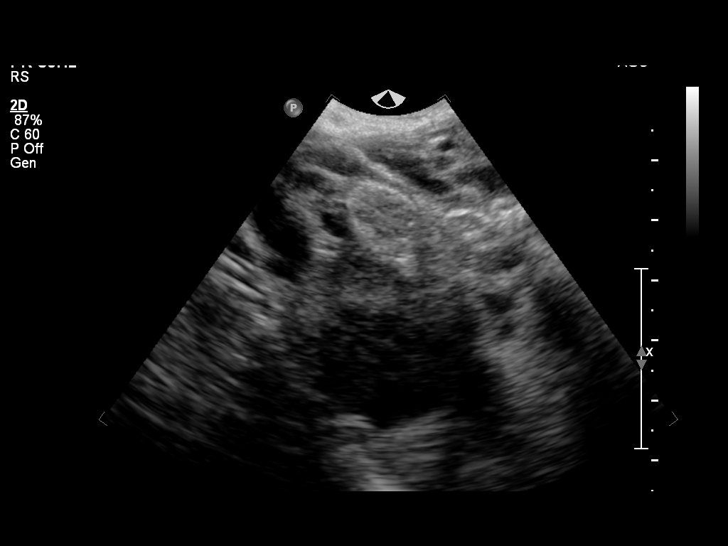
[im 32/36]
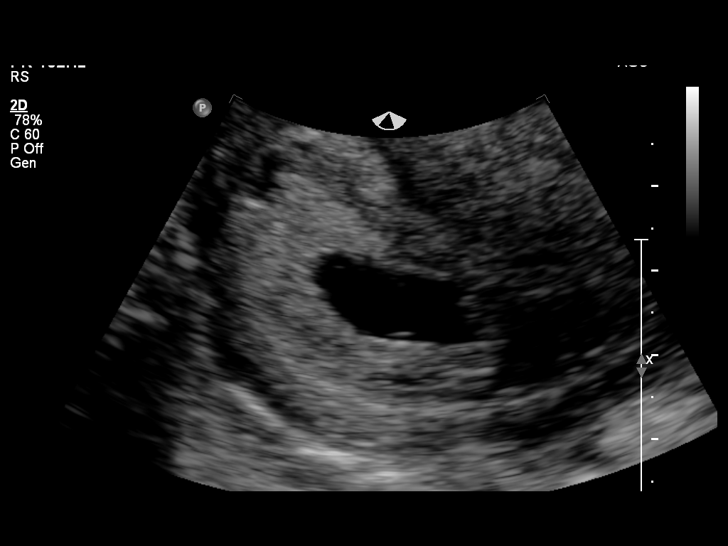
[im 34/36]
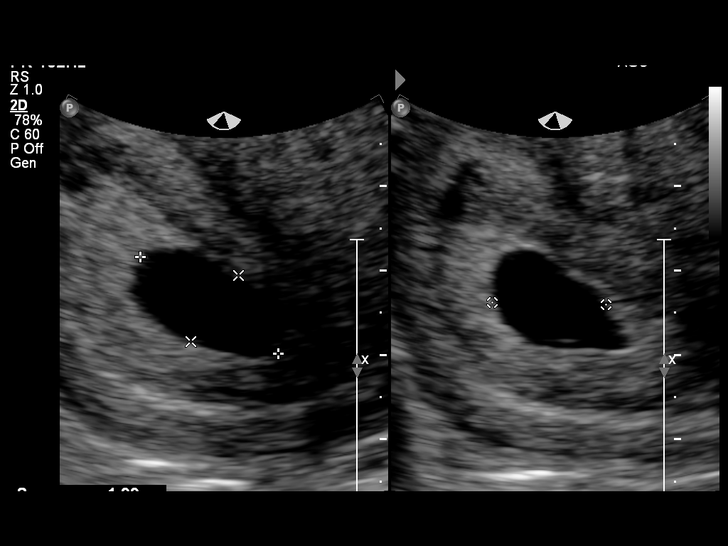

[13 of 28 positions shown; findings below may reference images not displayed]

FINDINGS: Intrauterine gestational sac: Intrauterine gestational sac is
demonstrated. Sac size slightly larger than on prior study.

Yolk sac:  Not identified.

Embryo:  Not identified.

Cardiac Activity: Not identified.

MSD: 14.4  mm   6 w   2  d

Maternal uterus/adnexae: No myometrial mass lesions. Small
subchorionic hemorrhage is suggested. Both ovaries are visualized
and appear normal. No abnormal adnexal masses. No free fluid.
IMPRESSION: Persistent finding of an intrauterine gestational sac without fetal
pole or yolk sac. This may represent early intrauterine pregnancy or
abnormal pregnancy/blighted ovum. Probable early intrauterine
gestational sac, but no yolk sac, fetal pole, or cardiac activity
yet visualized. Recommend follow-up quantitative B-HCG levels and
follow-up US in 14 days to confirm and assess viability. This
recommendation follows SRU consensus guidelines: Diagnostic Criteria
for Nonviable Pregnancy Early in the First Trimester. N Engl J Med

## 2017-02-11 ENCOUNTER — Emergency Department (HOSPITAL_COMMUNITY)
Admission: EM | Admit: 2017-02-11 | Discharge: 2017-02-11 | Disposition: A | Discharge status: Home / Self Care | Payer: BLUE CROSS/BLUE SHIELD | Attending: Physician Assistant | Admitting: Physician Assistant

## 2017-02-11 ENCOUNTER — Emergency Department (HOSPITAL_COMMUNITY): Payer: BLUE CROSS/BLUE SHIELD

## 2017-02-11 ENCOUNTER — Encounter (HOSPITAL_COMMUNITY): Payer: Self-pay

## 2017-02-11 DIAGNOSIS — R05 Cough: Secondary | ICD-10-CM

## 2017-02-11 DIAGNOSIS — R0602 Shortness of breath: Secondary | ICD-10-CM | POA: Diagnosis present

## 2017-02-11 DIAGNOSIS — J069 Acute upper respiratory infection, unspecified: Secondary | ICD-10-CM

## 2017-02-11 DIAGNOSIS — J40 Bronchitis, not specified as acute or chronic: Secondary | ICD-10-CM

## 2017-02-11 DIAGNOSIS — R059 Cough, unspecified: Secondary | ICD-10-CM

## 2017-02-11 DIAGNOSIS — B9789 Other viral agents as the cause of diseases classified elsewhere: Secondary | ICD-10-CM

## 2017-02-11 LAB — I-STAT TROPONIN, ED: Troponin i, poc: 0 ng/mL (ref 0.00–0.08)

## 2017-02-11 LAB — CBC
HEMATOCRIT: 39.9 % (ref 36.0–46.0)
Hemoglobin: 13.3 g/dL (ref 12.0–15.0)
MCH: 31.3 pg (ref 26.0–34.0)
MCHC: 33.3 g/dL (ref 30.0–36.0)
MCV: 93.9 fL (ref 78.0–100.0)
PLATELETS: 314 10*3/uL (ref 150–400)
RBC: 4.25 MIL/uL (ref 3.87–5.11)
RDW: 12.2 % (ref 11.5–15.5)
WBC: 6.3 10*3/uL (ref 4.0–10.5)

## 2017-02-11 LAB — BASIC METABOLIC PANEL
Anion gap: 8 (ref 5–15)
BUN: 7 mg/dL (ref 6–20)
CHLORIDE: 108 mmol/L (ref 101–111)
CO2: 22 mmol/L (ref 22–32)
CREATININE: 0.76 mg/dL (ref 0.44–1.00)
Calcium: 9.4 mg/dL (ref 8.9–10.3)
GFR calc Af Amer: >60 mL/min (ref >60)
GFR calc non Af Amer: >60 mL/min (ref >60)
Glucose, Bld: 113 mg/dL — ABNORMAL HIGH (ref 65–99)
Potassium: 4 mmol/L (ref 3.5–5.1)
Sodium: 138 mmol/L (ref 135–145)

## 2017-02-11 MED ORDER — PREDNISONE 20 MG PO TABS
60.0000 mg | ORAL_TABLET | Freq: Once | ORAL | Status: AC
  Administered 2017-02-11: 60 mg via ORAL
  Filled 2017-02-11: qty 3

## 2017-02-11 MED ORDER — PREDNISONE 50 MG PO TABS: 50.0000 mg | ORAL_TABLET | Freq: Every day | ORAL | 0 refills | Status: DC

## 2017-02-11 MED ORDER — AEROCHAMBER PLUS FLO-VU MEDIUM MISC: 1.0000 | Freq: Once | Status: DC

## 2017-02-11 MED ORDER — ALBUTEROL SULFATE HFA 108 (90 BASE) MCG/ACT IN AERS: 2.0000 | INHALATION_SPRAY | Freq: Four times a day (QID) | RESPIRATORY_TRACT | Status: DC

## 2017-02-11 MED ORDER — PROMETHAZINE-DM 6.25-15 MG/5ML PO SYRP: 5.0000 mL | ORAL_SOLUTION | Freq: Four times a day (QID) | ORAL | 0 refills | Status: DC | PRN

## 2017-02-11 MED ORDER — GUAIFENESIN ER 1200 MG PO TB12: 1.0000 | ORAL_TABLET | Freq: Two times a day (BID) | ORAL | 0 refills | Status: DC

## 2017-02-11 MED ORDER — IPRATROPIUM BROMIDE 0.02 % IN SOLN
0.5000 mg | RESPIRATORY_TRACT | Status: AC
  Administered 2017-02-11: 0.5 mg via RESPIRATORY_TRACT
  Filled 2017-02-11: qty 2.5

## 2017-02-11 MED ORDER — ALBUTEROL SULFATE (2.5 MG/3ML) 0.083% IN NEBU
5.0000 mg | INHALATION_SOLUTION | Freq: Once | RESPIRATORY_TRACT | Status: AC
  Administered 2017-02-11: 5 mg via RESPIRATORY_TRACT
  Filled 2017-02-11: qty 6

## 2017-02-11 NOTE — ED Provider Notes (Signed)
[facility]-EMER[facility]ENCY DEPT Provider Note   CSN: [id number] Arrival date & time: 02/11/17  [id number]     History   Chief Complaint Chief Complaint  Patient presents with  . Cough  . Shortness of Breath    HPI [name] Fry is a 36 y.o. female.  HPI Patient presents to the emergency department with cough, chest soreness over the last 5 days.  The patient states that her chest soreness is worse with her cough.  The patient states that she does feel a tightness when she coughs as well.  Patient states that she has also had some nasal discharge and drainage.  Patient states she may have some mild fevers but is unsure.  Patient states she did not take any medications prior to arrival.  States nothing seems make the condition better.  She states that activity seems to make the cough worse.  Patient, states she has also had some mild dizziness during coughing episodes. The patient denies  shortness of breath, headache,blurred vision, neck pain, fever, weakness, numbness,  anorexia, edema, abdominal pain, nausea, vomiting, diarrhea, rash, back pain, dysuria, hematemesis, bloody stool, near syncope, or syncope. Past Medical History:  Diagnosis Date  . Asthma   . Heart murmur    as a child  . Migraines   . Vaginal Pap smear, abnormal     Patient Active Problem List   Diagnosis Date Noted  . Migraine 03/28/2015    Past Surgical History:  Procedure Laterality Date  . CESAREAN SECTION    . CESAREAN SECTION    . DILATION AND CURETTAGE OF UTERUS N/A 03/06/2015   Procedure: Suction DILATATION AND CURETTAGE;  Surgeon: Jaymes GraffNaima Dillard, MD;  Location: [facility];  Service: Gynecology;  Laterality: N/A;  . OPERATIVE ULTRASOUND N/A 03/06/2015   Procedure: OPERATIVE ULTRASOUND;  Surgeon: Jaymes GraffNaima Dillard, MD;  Location: [facility];  Service: Gynecology;  Laterality: N/A;  . VAGINAL BIRTH AFTER CESAREAN SECTION  01/10/06  . WISDOM TOOTH EXTRACTION      OB History    Gravida Para Term Preterm AB Living   6 3 2 1 2 3    SAB TAB Ectopic Multiple Live Births   2               Home Medications    Prior to Admission medications   Medication Sig Start Date End Date Taking? Authorizing Provider  albuterol (PROVENTIL HFA;VENTOLIN HFA) 108 (90 BASE) MCG/ACT inhaler Inhale 2 puffs into the lungs every 4 (four) hours as needed for wheezing or shortness of breath. 03/31/14   Eber HongMiller, Brian, MD  amitriptyline (ELAVIL) 10 MG tablet Take 1 tablet (10 mg total) by mouth at bedtime. for one week, and then 2 tab at night for one week, and then 3 tab at night 03/28/15   Marvel PlanXu, Jindong, MD  calcium carbonate (TUMS EX) 750 MG chewable tablet Chew 2 tablets by mouth daily as needed for heartburn.    [provider]  cephALEXin (KEFLEX) 500 MG capsule Take 1 capsule (500 mg total) by mouth 4 (four) times daily. Patient not taking: Reported on 03/28/2015 02/23/15   Tilda BurrowFerguson, John V, MD  cetirizine (ZYRTEC) 10 MG tablet Take 10 mg by mouth daily as needed for allergies.    [provider]  EPINEPHrine (EPIPEN 2-PAK) 0.3 mg/0.3 mL IJ SOAJ injection Inject 0.3 mLs (0.3 mg total) into the muscle once. Patient taking differently: Inject 0.3 mg into the muscle once as needed (for allergic reaction).  09/06/14   Teressa LowerPickering, Vrinda, NP  fluticasone (VERAMYST) 27.5 MCG/SPRAY nasal spray Place 1 spray into the nose daily as needed for allergies.    [provider]  MONTELUKAST SODIUM PO Take 1 tablet by mouth daily as needed (allergies).    [provider]  Olopatadine HCl (PATADAY) 0.2 % SOLN Place 1 drop into both eyes daily as needed (dry eyes and allergies).     [provider]  Prenat w/o A Vit-FeFum-FePo-FA (CONCEPT OB) 130-92.4-1 MG CAPS Take 1 tablet by mouth daily. Patient not taking: Reported on 03/28/2015 02/04/15   Katrinka Blazing, [place], CNM  traMADol (ULTRAM) 50 MG tablet Take 100 mg by mouth every 6 (six) hours as needed for moderate pain or severe pain (migraines).    [provider]     Family History Family History  Problem Relation Age of Onset  . Asthma Father     Social History Social History  Substance Use Topics  . Smoking status: Never Smoker  . Smokeless tobacco: Never Used  . Alcohol use 0.0 oz/week     Comment: socially; occasionally 2 beers or  wine     Allergies   Tylenol [acetaminophen]; Aleve [naproxen sodium]; Other; and Vicodin [hydrocodone-acetaminophen]   Review of Systems Review of Systems All other systems negative except as documented in the HPI. All pertinent positives and negatives as reviewed in the HPI.  Physical Exam Updated Vital Signs BP 139/79 (BP Location: Right Arm)   Pulse 100   Temp 98.2 F (36.8 C) (Oral)   Resp (!) 22   Ht 5\' 9"  (1.753 m)   Wt 80.7 kg (178 lb)   LMP 02/06/2017 (Exact Date)   SpO2 100%   BMI 26.29 kg/m   Physical Exam  Constitutional: She is oriented to person, place, and time. She appears well-developed and well-nourished. No distress.  HENT:  Head: Normocephalic and atraumatic.  Mouth/Throat: Oropharynx is clear and moist.  Eyes: Pupils are equal, round, and reactive to light.  Neck: Normal range of motion. Neck supple.  Cardiovascular: Normal rate, regular rhythm and normal heart sounds.  Exam reveals no gallop and no friction rub.   No murmur heard. Pulmonary/Chest: Effort normal and breath sounds normal. No respiratory distress. She has no wheezes.  Abdominal: Soft. Bowel sounds are normal. She exhibits no distension. There is no tenderness.  Neurological: She is alert and oriented to person, place, and time. She exhibits normal muscle tone. Coordination normal.  Skin: Skin is warm and dry. Capillary refill takes less than 2 seconds. No rash noted. No erythema.  Psychiatric: She has a normal mood and affect. Her behavior is normal.  Nursing note and vitals reviewed.    ED Treatments / Results  Labs (all labs ordered are listed, but only abnormal results are displayed) Labs  Reviewed  BASIC METABOLIC PANEL - Abnormal; Notable for the following:       Result Value   Glucose, Bld 113 (*)    All other components within normal limits  CBC  I-STAT TROPOININ, ED    EKG  EKG Interpretation  Date/Time:  Thursday Feb 11 2017 09:13:04 EDT Ventricular Rate:  91 PR Interval:  142 QRS Duration: 82 QT Interval:  346 QTC Calculation: 425 R Axis:   44 Text Interpretation:  Normal sinus rhythm Normal ECG Normal sinus rhythm No significant change since last tracing Confirmed by Bary Castilla ([id number]) on 02/11/2017 9:17:25 AM Also confirmed by Bary Castilla ([id number])  on 02/11/2017 10:40:27 AM       Radiology Dg Chest 2  View  Result Date: 02/11/2017 CLINICAL DATA:  Cough, shortness of breath and dizziness for 6 days. EXAM: CHEST  2 VIEW COMPARISON:  PA and lateral chest 03/31/2014. FINDINGS: The lungs are clear. Heart size is normal. No pneumothorax or pleural effusion. No bony abnormality. IMPRESSION: Negative chest. Electronically Signed   By: Drusilla Kanner M.D.   On: 02/11/2017 09:39    Procedures Procedures (including critical care time)  Medications Ordered in ED Medications  albuterol (PROVENTIL) (2.5 MG/3ML) 0.083% nebulizer solution 5 mg (5 mg Nebulization Given 02/11/17 1119)  ipratropium (ATROVENT) nebulizer solution 0.5 mg (0.5 mg Nebulization Given 02/11/17 1119)  predniSONE (DELTASONE) tablet 60 mg (60 mg Oral Given 02/11/17 1119)     Initial Impression / Assessment and Plan / ED Course  I have reviewed the triage vital signs and the nursing notes.  Pertinent labs & imaging results that were available during my care of the patient were reviewed by me and considered in my medical decision making (see chart for details).     Patient be treated for URI.  Patient is been stable here in the emergency department.  She is maintaining good oxygenation.  Patient will be advised follow-up with her primary care Dr. told to return here as needed.   Patient agrees the plan and all questions were answered  Final Clinical Impressions(s) / ED Diagnoses   Final diagnoses:  None    New Prescriptions New Prescriptions   No medications on file     Charlestine Night, Cordelia Poche 02/11/17 1246    Abelino Derrick, MD 02/12/17 1032

## 2017-02-11 NOTE — ED Triage Notes (Signed)
Pt states she has had cough, shortness of breath, and tightness in her chest X1 week. Pt has cough present in triage. Lung sounds are clear. Skin warm and dry.

## 2017-02-11 NOTE — Discharge Instructions (Signed)
Return here as needed.  Follow up with a primary care doctor °

## 2017-02-11 NOTE — ED Notes (Signed)
Pt is in stable condition upon d/c and ambulates from ED. 

## 2017-04-15 NOTE — Telephone Encounter (Signed)
See note

## 2017-04-16 ENCOUNTER — Encounter (HOSPITAL_COMMUNITY): Payer: Self-pay | Admitting: *Deleted

## 2017-04-16 ENCOUNTER — Emergency Department (HOSPITAL_COMMUNITY)
Admission: EM | Admit: 2017-04-16 | Discharge: 2017-04-16 | Disposition: A | Discharge status: Home / Self Care | Payer: BLUE CROSS/BLUE SHIELD | Attending: Emergency Medicine | Admitting: Emergency Medicine

## 2017-04-16 DIAGNOSIS — L0211 Cutaneous abscess of neck: Secondary | ICD-10-CM | POA: Insufficient documentation

## 2017-04-16 DIAGNOSIS — J45909 Unspecified asthma, uncomplicated: Secondary | ICD-10-CM | POA: Diagnosis not present

## 2017-04-16 DIAGNOSIS — Z79899 Other long term (current) drug therapy: Secondary | ICD-10-CM | POA: Diagnosis not present

## 2017-04-16 DIAGNOSIS — L0291 Cutaneous abscess, unspecified: Secondary | ICD-10-CM

## 2017-04-16 DIAGNOSIS — R221 Localized swelling, mass and lump, neck: Secondary | ICD-10-CM | POA: Diagnosis present

## 2017-04-16 MED ORDER — LIDOCAINE HCL (PF) 1 % IJ SOLN
10.0000 mL | Freq: Once | INTRAMUSCULAR | Status: AC
  Administered 2017-04-16: 10 mL via INTRADERMAL
  Filled 2017-04-16: qty 10

## 2017-04-16 NOTE — ED Provider Notes (Signed)
[facility]-EMERGENCY DEPT Provider Note   CSN: [id number] Arrival date & time: 04/16/17  1356  By signing my name below, I, [name] Fry, attest that this documentation has been prepared under the direction and in the presence of [name] FreerLindsey Emmery Seiler, PA-C. Electronically Signed: Deland PrettySherilynn Fry, ED Scribe. 04/16/17. 4:10 PM.  History   Chief Complaint Chief Complaint  Patient presents with  . Abscess   The history is provided by the patient. No language interpreter was used.   HPI Comments: [name] Fry is a 36 y.o. female who presents to the Emergency Department with progressively worsening abscess to the right posterior lateral neck. Patient reports that this is been ongoing for the last week. She states that at the onset of symptoms she may possibly been bitten by a spider. She reports that initially she had an episode where she was febrile at 100.5 but states that that has resolved. Patient reports that she has had continued swelling/redness to the bright posterior lateral neck since then. He reports associated surrounding soreness. She has not tried any medications. She denies any other at-home treatment.   Past Medical History:  Diagnosis Date  . Asthma   . Heart murmur    as a child  . Migraines   . Vaginal Pap smear, abnormal     Patient Active Problem List   Diagnosis Date Noted  . Migraine 03/28/2015    Past Surgical History:  Procedure Laterality Date  . CESAREAN SECTION    . CESAREAN SECTION    . DILATION AND CURETTAGE OF UTERUS N/A 03/06/2015   Procedure: Suction DILATATION AND CURETTAGE;  Surgeon: [name] GraffNaima Dillard, MD;  Location: [facility];  Service: Gynecology;  Laterality: N/A;  . OPERATIVE ULTRASOUND N/A 03/06/2015   Procedure: OPERATIVE ULTRASOUND;  Surgeon: [name] GraffNaima Dillard, MD;  Location: [facility];  Service: Gynecology;  Laterality: N/A;  . VAGINAL BIRTH AFTER CESAREAN SECTION  01/10/06  . WISDOM TOOTH EXTRACTION      OB History    Gravida Para Term Preterm AB Living   6 3 2 1 2 3    SAB TAB Ectopic Multiple Live Births   2               Home Medications    Prior to Admission medications   Medication Sig Start Date End Date Taking? Authorizing Provider  albuterol (PROVENTIL HFA;VENTOLIN HFA) 108 (90 BASE) MCG/ACT inhaler Inhale 2 puffs into the lungs every 4 (four) hours as needed for wheezing or shortness of breath. 03/31/14   Eber HongMiller, Brian, MD  amitriptyline (ELAVIL) 10 MG tablet Take 1 tablet (10 mg total) by mouth at bedtime. for one week, and then 2 tab at night for one week, and then 3 tab at night 03/28/15   Marvel PlanXu, Jindong, MD  calcium carbonate (TUMS EX) 750 MG chewable tablet Chew 2 tablets by mouth daily as needed for heartburn.    [provider]  cephALEXin (KEFLEX) 500 MG capsule Take 1 capsule (500 mg total) by mouth 4 (four) times daily. Patient not taking: Reported on 03/28/2015 02/23/15   [name] Fry, John V, MD  cetirizine (ZYRTEC) 10 MG tablet Take 10 mg by mouth daily as needed for allergies.    [provider]  EPINEPHrine (EPIPEN 2-PAK) 0.3 mg/0.3 mL IJ SOAJ injection Inject 0.3 mLs (0.3 mg total) into the muscle once. Patient taking differently: Inject 0.3 mg into the muscle once as needed (for allergic reaction).  09/06/14   Teressa LowerPickering, Vrinda, NP  fluticasone (VERAMYST) 27.5 MCG/SPRAY nasal spray  Place 1 spray into the nose daily as needed for allergies.    [provider]  Guaifenesin 1200 MG TB12 Take 1 tablet (1,200 mg total) by mouth 2 (two) times daily. 02/11/17   Lawyer, [name] Deerhristopher, PA-C  MONTELUKAST SODIUM PO Take 1 tablet by mouth daily as needed (allergies).    [provider]  Olopatadine HCl (PATADAY) 0.2 % SOLN Place 1 drop into both eyes daily as needed (dry eyes and allergies).     [provider]  predniSONE (DELTASONE) 50 MG tablet Take 1 tablet (50 mg total) by mouth daily. 02/11/17   Lawyer, [name] Deerhristopher, PA-C  Prenat w/o A Vit-FeFum-FePo-FA (CONCEPT OB) 130-92.4-1 MG CAPS Take 1  tablet by mouth daily. Patient not taking: Reported on 03/28/2015 02/04/15   [name] Fry, [place], CNM  promethazine-dextromethorphan (PROMETHAZINE-DM) 6.25-15 MG/5ML syrup Take 5 mLs by mouth 4 (four) times daily as needed for cough. 02/11/17   Lawyer, [name] Deerhristopher, PA-C  traMADol (ULTRAM) 50 MG tablet Take 100 mg by mouth every 6 (six) hours as needed for moderate pain or severe pain (migraines).    [provider]    Family History Family History  Problem Relation Age of Onset  . Asthma Father     Social History Social History  Substance Use Topics  . Smoking status: Never Smoker  . Smokeless tobacco: Never Used  . Alcohol use 0.0 oz/week     Comment: socially; occasionally 2 beers or  wine     Allergies   Tylenol [acetaminophen]; Aleve [naproxen sodium]; Other; and Vicodin [hydrocodone-acetaminophen]   Review of Systems Review of Systems  Constitutional: Positive for fever (resolved). Negative for chills.  Musculoskeletal:       Neck soreness  Skin: Positive for color change and wound.     Physical Exam Updated Vital Signs BP (!) 141/89 (BP Location: Left Arm)   Pulse 76   Temp 98.4 F (36.9 C) (Oral)   Resp 18   LMP 04/01/2017   SpO2 99%   Physical Exam  Constitutional: She appears well-developed and well-nourished.  Sitting comfortably on examination table  HENT:  Head: Normocephalic and atraumatic.  Eyes: Conjunctivae and EOM are normal. Right eye exhibits no discharge. Left eye exhibits no discharge. No scleral icterus.  Neck: Full passive range of motion without pain. Neck supple. No neck rigidity.    Full flexion/extension and lateral movement of neck fully intact. No bony midline tenderness. No deformities or crepitus. Neck is supple without any rigidity. No meningismal signs.  Pulmonary/Chest: Effort normal.  Neurological: She is alert.  Skin: Skin is warm and dry. Capillary refill takes less than 2 seconds.  Refer to neck exam  Psychiatric: She  has a normal mood and affect. Her speech is normal and behavior is normal.  Nursing note and vitals reviewed.    ED Treatments / Results   DIAGNOSTIC STUDIES: Oxygen Saturation is 99% on RA, normal by my interpretation.   COORDINATION OF CARE: 4:10 PM-Discussed next steps with pt. Pt verbalized understanding and is agreeable with the plan.   Labs (all labs ordered are listed, but only abnormal results are displayed) Labs Reviewed - No data to display  EKG  EKG Interpretation None       Radiology No results found.  Procedures .Marland Kitchen.Incision and Drainage Date/Time: 04/16/2017 5:10 PM Performed by: [name] FreerLAYDEN, Solenne Manwarren A Authorized by: Mancel BaleWENTZ, ELLIOTT   Consent:    Consent obtained:  Verbal   Consent given by:  Patient   Risks discussed:  Incomplete drainage  and bleeding Location:    Type:  Abscess   Location:  Neck   Neck location:  R posterior Pre-procedure details:    Skin preparation:  Betadine Anesthesia (see MAR for exact dosages):    Anesthesia method:  Local infiltration   Local anesthetic:  Lidocaine 1% w/o epi Procedure type:    Complexity:  Simple Procedure details:    Incision types:  Single straight   Scalpel blade:  11   Wound management:  Probed and deloculated, irrigated with saline and extensive cleaning   Drainage:  Purulent   Drainage amount:  Copious   Wound treatment:  Drain placed   Packing materials:  1/4 in iodoform gauze Post-procedure details:    Patient tolerance of procedure:  Tolerated well, no immediate complications Comments:     Once the wound was anesthetized, it was thoroughly cleaned with Betadine. The wound was opened with a single incision. There was copious amount of purulent material. There is also cystic material that was present. Suspect that there may be a component of epidermoid cyst with underlying abscess. The abscess was big enough to warrant a drain , which was placed. Patient tolerated procedure well without any  crepitations.   (including critical care time)  Medications Ordered in ED Medications  lidocaine (PF) (XYLOCAINE) 1 % injection 10 mL (10 mLs Intradermal Given 04/16/17 1535)     Initial Impression / Assessment and Plan / ED Course  I have reviewed the triage vital signs and the nursing notes.  Pertinent labs & imaging results that were available during my care of the patient were reviewed by me and considered in my medical decision making (see chart for details).     36 y.o. F who presents with abscess x 1 week. Patient is afebrile, non-toxic appearing, sitting comfortably on examination table. Vital signs reviewed and stable. No evidence of fever here in the department. Physical exam is consistent with abscess to the right posterior lateral neck. History/physical exam are not concerning for meningitis. Bedside ultrasound was performed which confirmed presence of abscess. There does not appear to be any surrounding cellulitis. Plan to I&D in the department.  Patient with skin abscess amenable to incision and drainage.  Abscess was  large enough to warrant packing or drain. Plan to have patient return for wound recheck in 2 days. Encouraged home warm soaks and flushing. No antibiotic therapy is indicated.  EMERGENCY DEPARTMENT US SOFT TISSUE INTERPRETATION "Study: Limited Soft Tissue Ultrasound"  INDICATIONS: Soft tissue infection Multiple views of the body part were obtained in real-time with a multi-frequency linear probe  PERFORMED BY: Myself IMAGES ARCHIVED?: Yes SIDE:Right  BODY PART:Neck INTERPRETATION:  Abcess present and No cellulitis noted    Final Clinical Impressions(s) / ED Diagnoses   Final diagnoses:  Abscess    New Prescriptions New Prescriptions   No medications on file   I personally performed the services described in this documentation, which was scribed in my presence. The recorded information has been reviewed and is accurate.     Maxwell Caul,  PA-C 04/16/17 1734    Mancel Bale, MD 04/17/17 [phone]

## 2017-04-16 NOTE — ED Notes (Signed)
Declined W/C at D/C and was escorted to lobby by RN. 

## 2017-04-16 NOTE — ED Triage Notes (Signed)
Pt reports possible spider bite on right side of neck x 1 week, reports possible fever. No acute distress is noted at triage.

## 2017-04-16 NOTE — Discharge Instructions (Signed)
Apply warm compresses to the area to help continue express drainage.   Keep the wound clean and dry. Gently wash the wound with soap and water and make sure to pat it dry.   You can take Naprosyn as directed for pain.  Apply ice to the effected area.   Followup with the Emergency Department  in 2 days for wound recheck and packing removal.   Return to the Emergency Department if you experienced any worsening/spreading redness or swelling, fever, worsening pain, or any other worsening or concerning symptoms.

## 2017-04-18 ENCOUNTER — Encounter (HOSPITAL_COMMUNITY): Payer: Self-pay | Admitting: Emergency Medicine

## 2017-04-18 ENCOUNTER — Emergency Department (HOSPITAL_COMMUNITY)
Admission: EM | Admit: 2017-04-18 | Discharge: 2017-04-18 | Disposition: A | Discharge status: Home / Self Care | Payer: BLUE CROSS/BLUE SHIELD | Attending: Emergency Medicine | Admitting: Emergency Medicine

## 2017-04-18 DIAGNOSIS — L0211 Cutaneous abscess of neck: Secondary | ICD-10-CM | POA: Insufficient documentation

## 2017-04-18 DIAGNOSIS — Z5189 Encounter for other specified aftercare: Secondary | ICD-10-CM

## 2017-04-18 NOTE — Discharge Instructions (Signed)
Please continue to keep the wound clean with warm soap and water and change the bandage at least once daily. The wound will close on its own with time.   If the skin around the abscess becomes red, hot to the touch, or if it becomes swollen or if you develop fever or chills, please return to the emergency department for reevaluation.

## 2017-04-18 NOTE — ED Triage Notes (Signed)
Pt seen on ED last Friday, had a drain placed on her right neck on a wound and was oriented to come back today to recheck the wound, pt c/o 6/10 neck pain.

## 2017-04-18 NOTE — ED Provider Notes (Signed)
[facility] Provider Note   CSN: [id number] Arrival date & time: 04/18/17  2100     History   Chief Complaint Chief Complaint  Patient presents with  . wound recheck    HPI [name] Fry is a 36 y.o. female who presents to the emergency department for a wound recheck to the right posterior neck from an abscess that was I&D in the emergency department on 8/3. She denies fever, chills, or redness, swelling, or warmth around the wound. No purulent drainage. No other complaints at this time.  The history is provided by the patient. No language interpreter was used.    Past Medical History:  Diagnosis Date  . Asthma   . Heart murmur    as a child  . Migraines   . Vaginal Pap smear, abnormal     Patient Active Problem List   Diagnosis Date Noted  . Migraine 03/28/2015    Past Surgical History:  Procedure Laterality Date  . CESAREAN SECTION    . CESAREAN SECTION    . DILATION AND CURETTAGE OF UTERUS N/A 03/06/2015   Procedure: Suction DILATATION AND CURETTAGE;  Surgeon: Jaymes GraffNaima Dillard, MD;  Location: [facility];  Service: Gynecology;  Laterality: N/A;  . OPERATIVE ULTRASOUND N/A 03/06/2015   Procedure: OPERATIVE ULTRASOUND;  Surgeon: Jaymes GraffNaima Dillard, MD;  Location: [facility];  Service: Gynecology;  Laterality: N/A;  . VAGINAL BIRTH AFTER CESAREAN SECTION  01/10/06  . WISDOM TOOTH EXTRACTION      OB History    Gravida Para Term Preterm AB Living   6 3 2 1 2 3    SAB TAB Ectopic Multiple Live Births   2               Home Medications    Prior to Admission medications   Medication Sig Start Date End Date Taking? Authorizing Provider  albuterol (PROVENTIL HFA;VENTOLIN HFA) 108 (90 BASE) MCG/ACT inhaler Inhale 2 puffs into the lungs every 4 (four) hours as needed for wheezing or shortness of breath. 03/31/14   Eber HongMiller, Brian, MD  amitriptyline (ELAVIL) 10 MG tablet Take 1 tablet (10 mg total) by mouth at bedtime. for one week, and then 2 tab at night for one week, and then 3  tab at night 03/28/15   Marvel PlanXu, Jindong, MD  calcium carbonate (TUMS EX) 750 MG chewable tablet Chew 2 tablets by mouth daily as needed for heartburn.    [provider]  cephALEXin (KEFLEX) 500 MG capsule Take 1 capsule (500 mg total) by mouth 4 (four) times daily. Patient not taking: Reported on 03/28/2015 02/23/15   Tilda BurrowFerguson, John V, MD  cetirizine (ZYRTEC) 10 MG tablet Take 10 mg by mouth daily as needed for allergies.    [provider]  EPINEPHrine (EPIPEN 2-PAK) 0.3 mg/0.3 mL IJ SOAJ injection Inject 0.3 mLs (0.3 mg total) into the muscle once. Patient taking differently: Inject 0.3 mg into the muscle once as needed (for allergic reaction).  09/06/14   Teressa LowerPickering, Vrinda, NP  fluticasone (VERAMYST) 27.5 MCG/SPRAY nasal spray Place 1 spray into the nose daily as needed for allergies.    [provider]  Guaifenesin 1200 MG TB12 Take 1 tablet (1,200 mg total) by mouth 2 (two) times daily. 02/11/17   Lawyer, Cristal Deerhristopher, PA-C  MONTELUKAST SODIUM PO Take 1 tablet by mouth daily as needed (allergies).    [provider]  Olopatadine HCl (PATADAY) 0.2 % SOLN Place 1 drop into both eyes daily as needed (dry eyes and allergies).  [provider]  predniSONE (DELTASONE) 50 MG tablet Take 1 tablet (50 mg total) by mouth daily. 02/11/17   Lawyer, Cristal Deerhristopher, PA-C  Prenat w/o A Vit-FeFum-FePo-FA (CONCEPT OB) 130-92.4-1 MG CAPS Take 1 tablet by mouth daily. Patient not taking: Reported on 03/28/2015 02/04/15   Katrinka BlazingSmith, [place], CNM  promethazine-dextromethorphan (PROMETHAZINE-DM) 6.25-15 MG/5ML syrup Take 5 mLs by mouth 4 (four) times daily as needed for cough. 02/11/17   Lawyer, Cristal Deerhristopher, PA-C  traMADol (ULTRAM) 50 MG tablet Take 100 mg by mouth every 6 (six) hours as needed for moderate pain or severe pain (migraines).    [provider]    Family History Family History  Problem Relation Age of Onset  . Asthma Father     Social History Social  History  Substance Use Topics  . Smoking status: Never Smoker  . Smokeless tobacco: Never Used  . Alcohol use 0.0 oz/week     Comment: socially; occasionally 2 beers or  wine     Allergies   Tylenol [acetaminophen]; Aleve [naproxen sodium]; Other; and Vicodin [hydrocodone-acetaminophen]   Review of Systems Review of Systems  Constitutional: Negative for chills and fever.  Skin: Positive for wound.     Physical Exam Updated Vital Signs BP 134/88 (BP Location: Left Arm)   Pulse 96   Temp 98.9 F (37.2 C) (Oral)   Resp 18   Ht 5\' 9"  (1.753 m)   Wt 80.7 kg (178 lb)   LMP 04/01/2017   SpO2 99%   BMI 26.29 kg/m   Physical Exam  Constitutional: No distress.  HENT:  Head: Normocephalic.  Eyes: Conjunctivae are normal.  Neck: Neck supple.  Cardiovascular: Normal rate and regular rhythm.  Exam reveals no gallop and no friction rub.   No murmur heard. Pulmonary/Chest: Effort normal. No respiratory distress.  Abdominal: Soft. She exhibits no distension.  Neurological: She is alert.  Skin: Skin is warm. No rash noted.  There is a well-healing wound to the right posterior neck. Minimal serous sanguinous drainage expressed from the incision and drainage site. No purulent material. The surrounding skin is not erythematous, edematous, or warm to the touch.  Psychiatric: Her behavior is normal.  Nursing note and vitals reviewed.    ED Treatments / Results  Labs (all labs ordered are listed, but only abnormal results are displayed) Labs Reviewed - No data to display  EKG  EKG Interpretation None       Radiology No results found.  Procedures Procedures (including critical care time)  Medications Ordered in ED Medications - No data to display   Initial Impression / Assessment and Plan / ED Course  I have reviewed the triage vital signs and the nursing notes.  Pertinent labs & imaging results that were available during my care of the patient were reviewed by me  and considered in my medical decision making (see chart for details).     Pt with abscess seen two days ago with I&D. Chart reviewed in [facility]. Pt has been doing a very good job of taking care of the site at home and has been taking medications as directed. The site is healthy appearing, with only a small amount of serosanguinous drainage around the skin but incision is open and without significant drainage. No surrounding cellulitis and no palpable fluctuance or induration indicating deeper infection or residual abscess. Pt is also without any pain or tenderness in the area. Discussed home care and return precautions.  Final Clinical Impressions(s) / ED Diagnoses   Final  diagnoses:  Wound check, abscess    New Prescriptions New Prescriptions   No medications on file     Barkley Boards, PA-C 04/18/17 2312    Lavera Guise, MD 04/18/17 [phone]

## 2020-01-08 ENCOUNTER — Ambulatory Visit: Payer: Self-pay | Attending: Internal Medicine

## 2020-01-08 DIAGNOSIS — Z20822 Contact with and (suspected) exposure to covid-19: Secondary | ICD-10-CM

## 2020-01-09 LAB — NOVEL CORONAVIRUS, NAA: SARS-CoV-2, NAA: NOT DETECTED

## 2020-01-09 LAB — SARS-COV-2, NAA 2 DAY TAT

## 2023-03-28 ENCOUNTER — Ambulatory Visit (HOSPITAL_COMMUNITY)
Admission: EM | Admit: 2023-03-28 | Discharge: 2023-03-28 | Disposition: A | Discharge status: Home / Self Care | Payer: BC Managed Care – PPO

## 2023-03-28 ENCOUNTER — Emergency Department (HOSPITAL_COMMUNITY): Payer: BC Managed Care – PPO

## 2023-03-28 ENCOUNTER — Other Ambulatory Visit: Payer: Self-pay

## 2023-03-28 ENCOUNTER — Encounter (HOSPITAL_COMMUNITY): Payer: Self-pay | Admitting: *Deleted

## 2023-03-28 ENCOUNTER — Emergency Department (HOSPITAL_COMMUNITY)
Admission: EM | Admit: 2023-03-28 | Discharge: 2023-03-28 | Disposition: A | Discharge status: Home / Self Care | Payer: BC Managed Care – PPO | Attending: Emergency Medicine | Admitting: Emergency Medicine

## 2023-03-28 DIAGNOSIS — J45909 Unspecified asthma, uncomplicated: Secondary | ICD-10-CM | POA: Diagnosis not present

## 2023-03-28 DIAGNOSIS — S61215A Laceration without foreign body of left ring finger without damage to nail, initial encounter: Secondary | ICD-10-CM | POA: Insufficient documentation

## 2023-03-28 DIAGNOSIS — S6992XA Unspecified injury of left wrist, hand and finger(s), initial encounter: Secondary | ICD-10-CM | POA: Diagnosis present

## 2023-03-28 DIAGNOSIS — W293XXA Contact with powered garden and outdoor hand tools and machinery, initial encounter: Secondary | ICD-10-CM | POA: Diagnosis not present

## 2023-03-28 DIAGNOSIS — Z23 Encounter for immunization: Secondary | ICD-10-CM | POA: Diagnosis not present

## 2023-03-28 MED ORDER — LIDOCAINE HCL (PF) 1 % IJ SOLN
5.0000 mL | Freq: Once | INTRAMUSCULAR | Status: AC
  Administered 2023-03-28: 5 mL via INTRADERMAL
  Filled 2023-03-28: qty 5

## 2023-03-28 MED ORDER — TETANUS-DIPHTH-ACELL PERTUSSIS 5-2.5-18.5 LF-MCG/0.5 IM SUSY
0.5000 mL | PREFILLED_SYRINGE | Freq: Once | INTRAMUSCULAR | Status: AC
  Administered 2023-03-28: 0.5 mL via INTRAMUSCULAR
  Filled 2023-03-28: qty 0.5

## 2023-03-28 NOTE — ED Provider Notes (Signed)
[place] EMERGENCY DEPARTMENT AT [facility] Provider Note  MDM   HPI/ROS:  [name] Fry is a 42 y.o. right-hand-dominant female with a medical history as below who presents for evaluation of laceration to her ring finger on her left hand.  Reports this happened while using a hedge tremor.  Last tetanus was greater than 10 years ago.  Neurovascular intact.  Physical exam is notable for: - 2 cm laceration to the ring finger on her left hand, does not involve the nail or the nail bed.  On my initial evaluation, patient is:  -Vital signs stable. Patient afebrile, hemodynamically stable, and non-toxic appearing.   This patient's current presentation, including their history and physical exam, is most consistent with laceration.  Will provide a Tdap, order x-rays for evaluation of foreign bodies and fractures.  Laceration will be closed at bedside.  Interpretations, interventions, and the patient's course of care are documented below.      Disposition:  I discussed the plan for discharge with the patient and/or their surrogate at bedside prior to discharge and they were in agreement with the plan and verbalized understanding of the return precautions provided. All questions answered to the best of my ability. Ultimately, the patient was discharged in stable condition with stable vital signs. I am reassured that they are capable of close follow up and good social support at home.   Clinical Impression: No diagnosis found.  Rx / DC Orders ED Discharge Orders     None       The plan for this patient was discussed with Dr. Silverio Lay, who voiced agreement and who oversaw evaluation and treatment of this patient.   Clinical Complexity A medically appropriate history, review of systems, and physical exam was performed.  My independent interpretations of EKG, labs, and radiology are documented in the ED course above.   If decision rules were used in this patient's evaluation, they  are listed below.   Click here for ABCD2, HEART and other calculatorsREFRESH Note before signing   Patient's presentation is most consistent with acute complicated illness / injury requiring diagnostic workup.  Medical Decision Making Amount and/or Complexity of Data Reviewed Radiology: ordered.  Risk Prescription drug management.    HPI/ROS      See MDM section for pertinent HPI and ROS. A complete ROS was performed with pertinent positives/negatives noted above.   Past Medical History:  Diagnosis Date   Asthma    Heart murmur    as a child   Migraines    Vaginal Pap smear, abnormal     Past Surgical History:  Procedure Laterality Date   CESAREAN SECTION     CESAREAN SECTION     DILATION AND CURETTAGE OF UTERUS N/A 03/06/2015   Procedure: Suction DILATATION AND CURETTAGE;  Surgeon: Jaymes Graff, MD;  Location: [facility];  Service: Gynecology;  Laterality: N/A;   OPERATIVE ULTRASOUND N/A 03/06/2015   Procedure: OPERATIVE ULTRASOUND;  Surgeon: Jaymes Graff, MD;  Location: [facility];  Service: Gynecology;  Laterality: N/A;   VAGINAL BIRTH AFTER CESAREAN SECTION  01/10/06   WISDOM TOOTH EXTRACTION        Physical Exam   Vitals:   03/28/23 1827 03/28/23 1834  BP: 106/78   Pulse: (!) 110   Resp: 16   Temp: 98.6 F (37 C)   TempSrc: Oral   SpO2: 98%   Weight:  80.7 kg  Height:  5\' 9"  (1.753 m)    Physical Exam Vitals and nursing note  reviewed.  Constitutional:      General: She is not in acute distress.    Appearance: She is well-developed.  HENT:     Head: Normocephalic and atraumatic.  Eyes:     Conjunctiva/sclera: Conjunctivae normal.  Cardiovascular:     Rate and Rhythm: Normal rate and regular rhythm.     Heart sounds: No murmur heard. Pulmonary:     Effort: Pulmonary effort is normal. No respiratory distress.     Breath sounds: Normal breath sounds.  Abdominal:     Palpations: Abdomen is soft.     Tenderness: There is no abdominal tenderness.   Musculoskeletal:        General: No swelling.     Left hand: Laceration (Ring finger) present.     Cervical back: Neck supple.  Skin:    General: Skin is warm and dry.     Capillary Refill: Capillary refill takes less than 2 seconds.  Neurological:     Mental Status: She is alert.  Psychiatric:        Mood and Affect: Mood normal.      Procedures   If procedures were preformed on this patient, they are listed below:  Marland KitchenMarland KitchenLaceration Repair  Date/Time: 03/28/2023 8:27 PM  Performed by: Fayrene Helper, MD Authorized by: Charlynne Pander, MD   Consent:    Consent obtained:  Emergent situation and verbal   Consent given by:  Patient   Risks, benefits, and alternatives were discussed: yes   Anesthesia:    Anesthesia method:  Nerve block   Block location:  Base of the left finger   Block needle gauge:  27 G   Block anesthetic:  Lidocaine 1% WITH epi   Block technique:  Ring block   Block injection procedure:  Introduced needle, negative aspiration for blood, incremental injection, anatomic landmarks palpated and anatomic landmarks identified   Block outcome:  Anesthesia achieved Laceration details:    Location:  Finger   Finger location:  L ring finger   Length (cm):  3   Depth (mm):  2 Exploration:    Limited defect created (wound extended): no     Hemostasis achieved with:  Direct pressure   Imaging obtained: x-ray     Imaging outcome: foreign body not noted     Wound exploration: wound explored through full range of motion and entire depth of wound visualized     Contaminated: no   Treatment:    Amount of cleaning:  Standard   Irrigation solution:  Tap water   Irrigation volume:  1000   Irrigation method:  Tap   Visualized foreign bodies/material removed: no     Debridement:  None   Undermining:  None   Scar revision: no   Skin repair:    Repair method:  Sutures   Suture size:  5-0   Suture material:  Nylon   Suture technique:  Simple interrupted   Number  of sutures:  1 Approximation:    Approximation:  Close Repair type:    Repair type:  Simple Post-procedure details:    Dressing:  Antibiotic ointment   Procedure completion:  Tolerated well, no immediate complications    Fayrene Helper, MD Emergency Medicine PGY-2   Please note that this documentation was produced with the assistance of voice-to-text technology and may contain errors.    Fayrene Helper, MD 03/28/23 2328    Charlynne Pander, MD 03/30/23 [phone]

## 2023-03-28 NOTE — ED Provider Notes (Signed)
Seen briefly in triage  Here with laceration caused by hedge trimmer. Just occurred. Tip of left ring finger. No nail damage Unclear how deep the laceration goes Recommend to be evaluated in the ED. Likely needs to have imaging to rule out fracture   Nazly Digilio, Lurena Joiner, Cordelia Poche 03/28/23 1806

## 2023-03-28 NOTE — Discharge Instructions (Addendum)
You were seen today for finger laceration.  While you were here we got an x-ray that did not show any fracture.  We closed the laceration with 1 suture.  Will need to get it removed in 7 to 10 days.  Return to the emergency department if you have any concern for infection at the laceration.  Or any other concern.

## 2023-03-28 NOTE — ED Triage Notes (Signed)
The pt has a laceration to the lt middle finger  one hour ago with some hedge clippers  cut through the fingernail  lmp  last month

## 2023-04-06 ENCOUNTER — Ambulatory Visit (HOSPITAL_COMMUNITY)
Admission: EM | Admit: 2023-04-06 | Discharge: 2023-04-06 | Disposition: A | Discharge status: Home / Self Care | Payer: BC Managed Care – PPO

## 2023-04-06 ENCOUNTER — Encounter (HOSPITAL_COMMUNITY): Payer: Self-pay

## 2023-04-06 DIAGNOSIS — Z4802 Encounter for removal of sutures: Secondary | ICD-10-CM | POA: Diagnosis not present

## 2023-04-06 DIAGNOSIS — S61219D Laceration without foreign body of unspecified finger without damage to nail, subsequent encounter: Secondary | ICD-10-CM

## 2023-04-06 NOTE — ED Notes (Signed)
Marlow Baars, PA assessed patient .

## 2023-04-06 NOTE — ED Provider Notes (Addendum)
[facility]    CSN: [id number] Arrival date & time: 04/06/23  1730      History   Chief Complaint Chief Complaint  Patient presents with   Suture / Staple Removal    HPI [name] Fry is a 42 y.o. female.  Here for suture removal  Finger lac on 7/14 1 suture placed by ED No drainage, pain, swelling, redness   Past Medical History:  Diagnosis Date   Asthma    Heart murmur    as a child   Migraines    Vaginal Pap smear, abnormal     Patient Active Problem List   Diagnosis Date Noted   Migraine 03/28/2015    Past Surgical History:  Procedure Laterality Date   CESAREAN SECTION     CESAREAN SECTION     DILATION AND CURETTAGE OF UTERUS N/A 03/06/2015   Procedure: Suction DILATATION AND CURETTAGE;  Surgeon: Jaymes Graff, MD;  Location: [facility];  Service: Gynecology;  Laterality: N/A;   OPERATIVE ULTRASOUND N/A 03/06/2015   Procedure: OPERATIVE ULTRASOUND;  Surgeon: Jaymes Graff, MD;  Location: [facility];  Service: Gynecology;  Laterality: N/A;   VAGINAL BIRTH AFTER CESAREAN SECTION  01/10/06   WISDOM TOOTH EXTRACTION      OB History     Gravida  6   Para  3   Term  2   Preterm  1   AB  2   Living  3      SAB  2   IAB      Ectopic      Multiple      Live Births               Home Medications    Prior to Admission medications   Medication Sig Start Date End Date Taking? Authorizing Provider  albuterol (PROVENTIL HFA;VENTOLIN HFA) 108 (90 BASE) MCG/ACT inhaler Inhale 2 puffs into the lungs every 4 (four) hours as needed for wheezing or shortness of breath. 03/31/14   Eber Hong, MD  amitriptyline (ELAVIL) 10 MG tablet Take 1 tablet (10 mg total) by mouth at bedtime. for one week, and then 2 tab at night for one week, and then 3 tab at night 03/28/15   Marvel Plan, MD  calcium carbonate (TUMS EX) 750 MG chewable tablet Chew 2 tablets by mouth daily as needed for heartburn.    [provider]  cephALEXin (KEFLEX) 500 MG  capsule Take 1 capsule (500 mg total) by mouth 4 (four) times daily. Patient not taking: Reported on 03/28/2015 02/23/15   Tilda Burrow, MD  cetirizine (ZYRTEC) 10 MG tablet Take 10 mg by mouth daily as needed for allergies.    [provider]  EPINEPHrine (EPIPEN 2-PAK) 0.3 mg/0.3 mL IJ SOAJ injection Inject 0.3 mLs (0.3 mg total) into the muscle once. Patient taking differently: Inject 0.3 mg into the muscle once as needed (for allergic reaction).  09/06/14   Teressa Lower, NP  fluticasone (VERAMYST) 27.5 MCG/SPRAY nasal spray Place 1 spray into the nose daily as needed for allergies.    [provider]  Guaifenesin 1200 MG TB12 Take 1 tablet (1,200 mg total) by mouth 2 (two) times daily. 02/11/17   Lawyer, Cristal Deer, PA-C  MONTELUKAST SODIUM PO Take 1 tablet by mouth daily as needed (allergies).    [provider]  Olopatadine HCl (PATADAY) 0.2 % SOLN Place 1 drop into both eyes daily as needed (dry eyes and allergies).     [provider]  predniSONE (DELTASONE) 50 MG tablet Take 1 tablet (50 mg total) by mouth daily. 02/11/17   Lawyer, Cristal Deer, PA-C  Prenat w/o A Vit-FeFum-FePo-FA (CONCEPT OB) 130-92.4-1 MG CAPS Take 1 tablet by mouth daily. Patient not taking: Reported on 03/28/2015 02/04/15   Katrinka Blazing [place], CNM  promethazine-dextromethorphan (PROMETHAZINE-DM) 6.25-15 MG/5ML syrup Take 5 mLs by mouth 4 (four) times daily as needed for cough. 02/11/17   Lawyer, Cristal Deer, PA-C  traMADol (ULTRAM) 50 MG tablet Take 100 mg by mouth every 6 (six) hours as needed for moderate pain or severe pain (migraines).    [provider]    Family History Family History  Problem Relation Age of Onset   Asthma Father     Social History Social History   Tobacco Use   Smoking status: Never   Smokeless tobacco: Never  Vaping Use   Vaping status: Never Used  Substance Use Topics   Alcohol use: Yes    Alcohol/week: 0.0 standard drinks of alcohol     Comment: socially; occasionally 2 beers or  wine   Drug use: No     Allergies   Tylenol [acetaminophen], Aleve [naproxen sodium], Other, and Vicodin [hydrocodone-acetaminophen]   Review of Systems Review of Systems As per HPI  Physical Exam Triage Vital Signs ED Triage Vitals  Encounter Vitals Group     BP 04/06/23 1820 (!) 147/85     Systolic BP Percentile --      Diastolic BP Percentile --      Pulse Rate 04/06/23 1820 73     Resp 04/06/23 1820 16     Temp 04/06/23 1820 98.9 F (37.2 C)     Temp Source 04/06/23 1820 Oral     SpO2 04/06/23 1820 97 %     Weight --      Height --      Head Circumference --      Peak Flow --      Pain Score 04/06/23 1822 4     Pain Loc --      Pain Education --      Exclude from Growth Chart --    No data found.  Updated Vital Signs BP (!) 147/85 (BP Location: Left Arm)   Pulse 73   Temp 98.9 F (37.2 C) (Oral)   Resp 16   LMP 02/26/2023   SpO2 97%    Physical Exam Vitals and nursing note reviewed.  Constitutional:      General: She is not in acute distress. Musculoskeletal:     Cervical back: Normal range of motion.  Skin:    General: Skin is warm and dry.     Capillary Refill: Capillary refill takes less than 2 seconds.     Comments: Single suture in left hand ring finger. Suture knot is already loose. Distal sensation intact, cap refill < 2 seconds, no pain with palpation. No drainage or bleeding   Neurological:     Mental Status: She is alert and oriented to person, place, and time.      UC Treatments / Results  Labs (all labs ordered are listed, but only abnormal results are displayed) Labs Reviewed - No data to display  EKG   Radiology No results found.  Procedures Procedures (including critical care time)  Medications Ordered in UC Medications - No data to display  Initial Impression / Assessment and Plan / UC Course  I have reviewed the triage vital signs and the nursing notes.  Pertinent  labs &  imaging results that were available during my care of the patient were reviewed by me and considered in my medical decision making (see chart for details).  Suture removed by me Looks good, no sign of infection Discussed reasons to return. All questions answered.   Final Clinical Impressions(s) / UC Diagnoses   Final diagnoses:  Visit for suture removal  Laceration of finger of left hand without foreign body without damage to nail, unspecified finger, subsequent encounter     Discharge Instructions      Please return with any concerns      ED Prescriptions   None    PDMP not reviewed this encounter.   Lindzee Gouge, Ray Church 04/06/23 [address], Lurena Joiner, [place] 04/06/23 [phone]

## 2023-04-06 NOTE — ED Triage Notes (Signed)
Patient is requesting suture removal from the left middle finger.

## 2023-04-06 NOTE — Discharge Instructions (Addendum)
Please return with any concerns. °

## 2024-01-21 ENCOUNTER — Encounter (HOSPITAL_COMMUNITY): Payer: Self-pay

## 2024-01-21 ENCOUNTER — Ambulatory Visit (INDEPENDENT_AMBULATORY_CARE_PROVIDER_SITE_OTHER)

## 2024-01-21 ENCOUNTER — Ambulatory Visit (HOSPITAL_COMMUNITY): Admission: EM | Admit: 2024-01-21 | Discharge: 2024-01-21 | Disposition: A | Discharge status: Home / Self Care

## 2024-01-21 ENCOUNTER — Other Ambulatory Visit: Payer: Self-pay

## 2024-01-21 DIAGNOSIS — R051 Acute cough: Secondary | ICD-10-CM | POA: Diagnosis not present

## 2024-01-21 DIAGNOSIS — J4521 Mild intermittent asthma with (acute) exacerbation: Secondary | ICD-10-CM

## 2024-01-21 MED ORDER — ALBUTEROL SULFATE HFA 108 (90 BASE) MCG/ACT IN AERS
1.0000 | INHALATION_SPRAY | Freq: Four times a day (QID) | RESPIRATORY_TRACT | 3 refills | Status: AC | PRN
Start: 2024-01-21 — End: ?

## 2024-01-21 MED ORDER — IPRATROPIUM-ALBUTEROL 0.5-2.5 (3) MG/3ML IN SOLN
RESPIRATORY_TRACT | Status: AC
  Filled 2024-01-21: qty 3

## 2024-01-21 MED ORDER — PREDNISONE 20 MG PO TABS
40.0000 mg | ORAL_TABLET | Freq: Every day | ORAL | 0 refills | Status: AC
Start: 2024-01-21 — End: 2024-01-26

## 2024-01-21 MED ORDER — ALBUTEROL SULFATE HFA 108 (90 BASE) MCG/ACT IN AERS: 1.0000 | INHALATION_SPRAY | Freq: Four times a day (QID) | RESPIRATORY_TRACT | 3 refills | Status: DC | PRN

## 2024-01-21 MED ORDER — IPRATROPIUM-ALBUTEROL 0.5-2.5 (3) MG/3ML IN SOLN
3.0000 mL | Freq: Once | RESPIRATORY_TRACT | Status: AC
  Administered 2024-01-21: 3 mL via RESPIRATORY_TRACT

## 2024-01-21 MED ORDER — AZELASTINE HCL 0.1 % NA SOLN
1.0000 | Freq: Two times a day (BID) | NASAL | 1 refills | Status: AC
Start: 2024-01-21 — End: ?

## 2024-01-21 NOTE — Discharge Instructions (Addendum)
  1. Mild intermittent asthma with acute exacerbation (Primary) - DG Chest 2 View x-ray performed in UC shows no acute cardiopulmonary processes, no sign of consolidation or pneumonia. - ipratropium-albuterol  (DUONEB) 0.5-2.5 (3) MG/3ML nebulizer solution 3 mL performed in UC for wheezing and asthma exacerbation, wheezing substantially improved following nebulizer treatment. - predniSONE  (DELTASONE ) 20 MG tablet; Take 2 tablets (40 mg total) by mouth daily for 5 days.  Dispense: 10 tablet; Refill: 0 - albuterol  (VENTOLIN  HFA) 108 (90 Base) MCG/ACT inhaler; Inhale 1-2 puffs into the lungs every 6 (six) hours as needed for wheezing or shortness of breath.  Dispense: 8 g; Refill: 3 - azelastine (ASTELIN) 0.1 % nasal spray; Place 1 spray into both nostrils 2 (two) times daily. Use in each nostril as directed  Dispense: 30 mL; Refill: 1 -Continue to monitor symptoms for any change in severity if there is any escalation of current symptoms or development of new symptoms follow-up in ER for further evaluation and management.

## 2024-01-21 NOTE — ED Provider Notes (Signed)
 [facility] [place]  Note:  This document was prepared using Dragon voice recognition software and may include unintentional dictation errors.  MRN: [id number] DOB: 03-18-1981  Subjective:   [name] Fry is a 43 y.o. female presenting for cough, chest congestion, wheezing x 1 week.  Patient has been taking over-the-counter cough and cold remedies and Sudafed with minimal improvement to symptoms.  Patient denies any significant nasal congestion, sore throat, ear pain, body aches, fatigue.  Patient states she has past history of childhood asthma but no exacerbations in many years.  No chest pain, severe shortness of breath, weakness, dizziness.  No current facility-administered medications for this encounter.  Current Outpatient Medications:    azelastine (ASTELIN) 0.1 % nasal spray, Place 1 spray into both nostrils 2 (two) times daily. Use in each nostril as directed, Disp: 30 mL, Rfl: 1   predniSONE  (DELTASONE ) 20 MG tablet, Take 2 tablets (40 mg total) by mouth daily for 5 days., Disp: 10 tablet, Rfl: 0   albuterol  (VENTOLIN  HFA) 108 (90 Base) MCG/ACT inhaler, Inhale 1-2 puffs into the lungs every 6 (six) hours as needed for wheezing or shortness of breath., Disp: 8 g, Rfl: 3   cetirizine (ZYRTEC) 10 MG tablet, Take 10 mg by mouth daily as needed for allergies., Disp: , Rfl:    EPINEPHrine  (EPIPEN  2-PAK) 0.3 mg/0.3 mL IJ SOAJ injection, Inject 0.3 mLs (0.3 mg total) into the muscle once. (Patient taking differently: Inject 0.3 mg into the muscle once as needed (for allergic reaction). ), Disp: 1 Device, Rfl: 1   fluticasone (VERAMYST) 27.5 MCG/SPRAY nasal spray, Place 1 spray into the nose daily as needed for allergies., Disp: , Rfl:    Olopatadine HCl (PATADAY) 0.2 % SOLN, Place 1 drop into both eyes daily as needed (dry eyes and allergies). , Disp: , Rfl:    traMADol  (ULTRAM ) 50 MG tablet, Take 100 mg by mouth every 6 (six) hours as needed for moderate pain or severe pain  (migraines)., Disp: , Rfl:    Allergies  Allergen Reactions   Tylenol  [Acetaminophen ] Hives and Rash    And facial swelling   Aleve [Naproxen Sodium] Hives and Swelling   Other Hives, Swelling and Other (See Comments)    Pt states that she is allergic to all pain medications.  Able top take Ultram .    Vicodin [Hydrocodone -Acetaminophen ] Hives and Swelling    Facial swelling and rash    Past Medical History:  Diagnosis Date   Asthma    Heart murmur    as a child   Migraines    Vaginal Pap smear, abnormal      Past Surgical History:  Procedure Laterality Date   CESAREAN SECTION     CESAREAN SECTION     DILATION AND CURETTAGE OF UTERUS N/A 03/06/2015   Procedure: Suction DILATATION AND CURETTAGE;  Surgeon: Marylu Soda, MD;  Location: [facility];  Service: Gynecology;  Laterality: N/A;   OPERATIVE ULTRASOUND N/A 03/06/2015   Procedure: OPERATIVE ULTRASOUND;  Surgeon: Marylu Soda, MD;  Location: [facility];  Service: Gynecology;  Laterality: N/A;   VAGINAL BIRTH AFTER CESAREAN SECTION  01/10/06   WISDOM TOOTH EXTRACTION      Family History  Problem Relation Age of Onset   Asthma Father     Social History   Tobacco Use   Smoking status: Never   Smokeless tobacco: Never  Vaping Use   Vaping status: Never Used  Substance Use Topics   Alcohol use: Yes  Alcohol/week: 0.0 standard drinks of alcohol    Comment: socially; occasionally 2 beers or  wine   Drug use: No    ROS Refer to HPI for ROS details.  Objective:   Vitals: BP 135/88 (BP Location: Right Arm)   Pulse 87   Temp 97.9 F (36.6 C) (Oral)   Resp 20   LMP 01/21/2024 (Exact Date)   SpO2 94%   Physical Exam Vitals and nursing note reviewed.  Constitutional:      General: She is not in acute distress.    Appearance: Normal appearance. She is well-developed. She is not ill-appearing or toxic-appearing.  HENT:     Head: Normocephalic and atraumatic.     Nose: Nose normal. No congestion or rhinorrhea.      Mouth/Throat:     Mouth: Mucous membranes are moist.     Pharynx: Oropharynx is clear. No oropharyngeal exudate or posterior oropharyngeal erythema.  Eyes:     General:        Right eye: No discharge.        Left eye: No discharge.     Extraocular Movements: Extraocular movements intact.     Conjunctiva/sclera: Conjunctivae normal.  Cardiovascular:     Rate and Rhythm: Normal rate and regular rhythm.     Heart sounds: Normal heart sounds. No murmur heard. Pulmonary:     Effort: Pulmonary effort is normal. No respiratory distress.     Breath sounds: No stridor. Wheezing present. No rhonchi or rales.  Chest:     Chest wall: No tenderness.  Musculoskeletal:        General: Normal range of motion.  Skin:    General: Skin is warm and dry.  Neurological:     General: No focal deficit present.     Mental Status: She is alert and oriented to person, place, and time.  Psychiatric:        Mood and Affect: Mood normal.        Behavior: Behavior normal.     Procedures  No results found for this or any previous visit (from the past 24 hours).  No results found.   Assessment and Plan :     Discharge Instructions       1. Mild intermittent asthma with acute exacerbation (Primary) - DG Chest 2 View x-ray performed in UC shows no acute cardiopulmonary processes, no sign of consolidation or pneumonia. - ipratropium-albuterol  (DUONEB) 0.5-2.5 (3) MG/3ML nebulizer solution 3 mL performed in UC for wheezing and asthma exacerbation, wheezing substantially improved following nebulizer treatment. - predniSONE  (DELTASONE ) 20 MG tablet; Take 2 tablets (40 mg total) by mouth daily for 5 days.  Dispense: 10 tablet; Refill: 0 - albuterol  (VENTOLIN  HFA) 108 (90 Base) MCG/ACT inhaler; Inhale 1-2 puffs into the lungs every 6 (six) hours as needed for wheezing or shortness of breath.  Dispense: 8 g; Refill: 3 - azelastine (ASTELIN) 0.1 % nasal spray; Place 1 spray into both nostrils 2 (two) times  daily. Use in each nostril as directed  Dispense: 30 mL; Refill: 1 -Continue to monitor symptoms for any change in severity if there is any escalation of current symptoms or development of new symptoms follow-up in ER for further evaluation and management.    Darchelle Nunes B Khiana Camino   Tonesha Tsou, [place], [place] 01/21/24 2113

## 2024-01-21 NOTE — ED Triage Notes (Signed)
 Pt c/o chest tightness, cough, and headache x 1 week. Pt has taken OTC Dayquil, cold and flu med, cough/decongestant, Ibuprofen, and sudafed with no relief.

## 2024-01-25 ENCOUNTER — Ambulatory Visit (HOSPITAL_COMMUNITY): Payer: Self-pay
